# Patient Record
Sex: Female | Born: 1980 | Hispanic: Yes | Smoking: Never smoker
Health system: Southern US, Community
[De-identification: ages and names within clinical notes are randomized; demographics above are authoritative.]

---

## 2017-05-13 LAB — OB RESULTS CONSOLE ANTIBODY SCREEN: Antibody Screen: NEGATIVE

## 2017-05-13 LAB — OB RESULTS CONSOLE PLATELET COUNT: PLATELETS: 226

## 2017-05-13 LAB — OB RESULTS CONSOLE HEPATITIS B SURFACE ANTIGEN: HEP B S AG: NEGATIVE

## 2017-05-13 LAB — OB RESULTS CONSOLE HIV ANTIBODY (ROUTINE TESTING): HIV: NONREACTIVE

## 2017-05-13 LAB — OB RESULTS CONSOLE RUBELLA ANTIBODY, IGM: RUBELLA: IMMUNE

## 2017-05-13 LAB — OB RESULTS CONSOLE ABO/RH: RH Type: NEGATIVE

## 2017-05-13 LAB — OB RESULTS CONSOLE HGB/HCT, BLOOD
HCT: 36
Hemoglobin: 11.8

## 2017-05-13 LAB — OB RESULTS CONSOLE RPR: RPR: NONREACTIVE

## 2017-07-01 DIAGNOSIS — O09529 Supervision of elderly multigravida, unspecified trimester: Secondary | ICD-10-CM | POA: Insufficient documentation

## 2017-07-24 ENCOUNTER — Encounter: Payer: Self-pay | Admitting: *Deleted

## 2017-07-29 ENCOUNTER — Encounter: Payer: Self-pay | Admitting: *Deleted

## 2017-07-29 ENCOUNTER — Ambulatory Visit (INDEPENDENT_AMBULATORY_CARE_PROVIDER_SITE_OTHER): Payer: Medicaid Other | Admitting: Obstetrics & Gynecology

## 2017-07-29 ENCOUNTER — Encounter: Payer: Self-pay | Admitting: Obstetrics & Gynecology

## 2017-07-29 VITALS — BP 101/68 | HR 97 | Ht <= 58 in | Wt 263.0 lb

## 2017-07-29 DIAGNOSIS — Z6791 Unspecified blood type, Rh negative: Secondary | ICD-10-CM | POA: Diagnosis not present

## 2017-07-29 DIAGNOSIS — O4400 Placenta previa specified as without hemorrhage, unspecified trimester: Secondary | ICD-10-CM | POA: Insufficient documentation

## 2017-07-29 DIAGNOSIS — O0993 Supervision of high risk pregnancy, unspecified, third trimester: Secondary | ICD-10-CM

## 2017-07-29 DIAGNOSIS — O99213 Obesity complicating pregnancy, third trimester: Secondary | ICD-10-CM

## 2017-07-29 DIAGNOSIS — O99210 Obesity complicating pregnancy, unspecified trimester: Secondary | ICD-10-CM

## 2017-07-29 DIAGNOSIS — O34219 Maternal care for unspecified type scar from previous cesarean delivery: Secondary | ICD-10-CM

## 2017-07-29 DIAGNOSIS — O26899 Other specified pregnancy related conditions, unspecified trimester: Secondary | ICD-10-CM

## 2017-07-29 DIAGNOSIS — O4403 Placenta previa specified as without hemorrhage, third trimester: Secondary | ICD-10-CM

## 2017-07-29 DIAGNOSIS — O0990 Supervision of high risk pregnancy, unspecified, unspecified trimester: Secondary | ICD-10-CM

## 2017-07-29 MED ORDER — RHO D IMMUNE GLOBULIN 1500 UNIT/2ML IJ SOSY
300.0000 ug | PREFILLED_SYRINGE | Freq: Once | INTRAMUSCULAR | Status: AC
  Administered 2017-07-29: 300 ug via INTRAMUSCULAR

## 2017-07-29 NOTE — Progress Notes (Signed)
  Subjective:    Isabel Lewis is a [id number] 1560w2d being seen today for her first obstetrical visit.  Her obstetrical history is significant for 5 previous cesarean sections and complete placenta previa. Patient does intend to breast feed. Pregnancy history fully reviewed.  Patient reports no complaints.  Vitals:   07/29/17 1421 07/29/17 1425  BP: 101/68   Pulse: 97   Weight: 263 lb (119.3 kg)   Height:  4\' 10"  (1.473 m)    HISTORY: OB History  Gravida Para Term Preterm AB Living  8 5 5   2 5   SAB TAB Ectopic Multiple Live Births  1       5    # Outcome Date GA Lbr Len/2nd Weight Sex Delivery Anes PTL Lv  8 Current           7 Term 02/20/12    M CS-LTranv   LIV  6 Term 03/16/08    F CS-LTranv   LIV  5 Term 08/24/06    M CS-LTranv   LIV  4 Term 05/24/04    F CS-LTranv   LIV  3 Term 04/13/00    F CS-LTranv   LIV  2 AB           1 SAB              History reviewed. No pertinent past medical history. Past Surgical History:  Procedure Laterality Date  . CESAREAN SECTION     x5   History reviewed. No pertinent family history.   Exam    Uterus:   30 cm                                      Skin: normal coloration and turgor, no rashes    Neurologic: oriented, normal mood   Extremities: normal strength, tone, and muscle mass   HEENT PERRLA   Mouth/Teeth mucous membranes moist, pharynx normal without lesions and dental hygiene good   Neck supple   Cardiovascular: regular rate and rhythm   Respiratory:  appears well, vitals normal, no respiratory distress, acyanotic, normal RR   Abdomen: gravid          Assessment:    Pregnancy: [id number] Patient Active Problem List   Diagnosis Date Noted  . Supervision of high risk pregnancy, antepartum 07/29/2017  . Placenta previa 07/29/2017  . History of cesarean delivery, currently pregnant 07/29/2017  . Rh negative state in antepartum period 07/29/2017        Plan:     Initial labs reviewed Prenatal  vitamins. Problem list reviewed and updated. Genetic Screening discussed .  Ultrasound discussed; fetal survey: requested.  Follow up in 2 weeks. 50% of 30 min visit spent on counseling and coordination of care.  Repeat CS at 37 weeks if placenta previa 2 hr GTT needed   Scheryl DarterJames Arnold 07/29/2017

## 2017-08-03 ENCOUNTER — Ambulatory Visit (HOSPITAL_COMMUNITY)
Admission: RE | Admit: 2017-08-03 | Discharge: 2017-08-03 | Disposition: A | Discharge status: Home / Self Care | Payer: Medicaid Other | Source: Ambulatory Visit | Attending: Obstetrics & Gynecology | Admitting: Obstetrics & Gynecology

## 2017-08-03 ENCOUNTER — Other Ambulatory Visit: Payer: Medicaid Other

## 2017-08-03 ENCOUNTER — Encounter (HOSPITAL_COMMUNITY): Payer: Self-pay

## 2017-08-03 ENCOUNTER — Other Ambulatory Visit: Payer: Self-pay | Admitting: Obstetrics & Gynecology

## 2017-08-03 ENCOUNTER — Ambulatory Visit (HOSPITAL_COMMUNITY): Payer: Self-pay

## 2017-08-03 DIAGNOSIS — O99213 Obesity complicating pregnancy, third trimester: Secondary | ICD-10-CM

## 2017-08-03 DIAGNOSIS — O4403 Placenta previa specified as without hemorrhage, third trimester: Secondary | ICD-10-CM

## 2017-08-03 DIAGNOSIS — Z3689 Encounter for other specified antenatal screening: Secondary | ICD-10-CM | POA: Diagnosis not present

## 2017-08-03 DIAGNOSIS — Z3A30 30 weeks gestation of pregnancy: Secondary | ICD-10-CM | POA: Insufficient documentation

## 2017-08-03 DIAGNOSIS — O0990 Supervision of high risk pregnancy, unspecified, unspecified trimester: Secondary | ICD-10-CM

## 2017-08-03 DIAGNOSIS — O34219 Maternal care for unspecified type scar from previous cesarean delivery: Secondary | ICD-10-CM | POA: Diagnosis not present

## 2017-08-03 DIAGNOSIS — O0993 Supervision of high risk pregnancy, unspecified, third trimester: Secondary | ICD-10-CM | POA: Insufficient documentation

## 2017-08-03 DIAGNOSIS — O09523 Supervision of elderly multigravida, third trimester: Secondary | ICD-10-CM | POA: Diagnosis not present

## 2017-08-03 DIAGNOSIS — Z98891 History of uterine scar from previous surgery: Secondary | ICD-10-CM

## 2017-08-04 LAB — CBC
HEMATOCRIT: 35 % (ref 34.0–46.6)
Hemoglobin: 11.6 g/dL (ref 11.1–15.9)
MCH: 28.1 pg (ref 26.6–33.0)
MCHC: 33.1 g/dL (ref 31.5–35.7)
MCV: 85 fL (ref 79–97)
Platelets: 180 10*3/uL (ref 150–379)
RBC: 4.13 x10E6/uL (ref 3.77–5.28)
RDW: 14.2 % (ref 12.3–15.4)
WBC: 5.4 10*3/uL (ref 3.4–10.8)

## 2017-08-04 LAB — GLUCOSE TOLERANCE, 2 HOURS W/ 1HR
GLUCOSE, 2 HOUR: 135 mg/dL (ref 65–152)
Glucose, 1 hour: 186 mg/dL — ABNORMAL HIGH (ref 65–179)
Glucose, Fasting: 93 mg/dL — ABNORMAL HIGH (ref 65–91)

## 2017-08-04 LAB — HIV ANTIBODY (ROUTINE TESTING W REFLEX): HIV SCREEN 4TH GENERATION: NONREACTIVE

## 2017-08-04 LAB — RPR: RPR: NONREACTIVE

## 2017-08-04 NOTE — Addendum Note (Signed)
Encounter addended by: Adam PhenixArnold, James G, MD on: 08/04/2017 11:17 AM  Actions taken: Problem List reviewed

## 2017-08-05 ENCOUNTER — Encounter: Payer: Self-pay | Admitting: Obstetrics and Gynecology

## 2017-08-05 ENCOUNTER — Other Ambulatory Visit: Payer: Self-pay | Admitting: Obstetrics and Gynecology

## 2017-08-05 DIAGNOSIS — O24419 Gestational diabetes mellitus in pregnancy, unspecified control: Secondary | ICD-10-CM | POA: Insufficient documentation

## 2017-08-05 DIAGNOSIS — O24410 Gestational diabetes mellitus in pregnancy, diet controlled: Secondary | ICD-10-CM

## 2017-08-11 ENCOUNTER — Ambulatory Visit (INDEPENDENT_AMBULATORY_CARE_PROVIDER_SITE_OTHER): Payer: Medicaid Other | Admitting: Obstetrics & Gynecology

## 2017-08-11 ENCOUNTER — Encounter: Payer: Self-pay | Admitting: Obstetrics & Gynecology

## 2017-08-11 VITALS — BP 114/83 | HR 120 | Wt 263.5 lb

## 2017-08-11 DIAGNOSIS — O4403 Placenta previa specified as without hemorrhage, third trimester: Secondary | ICD-10-CM

## 2017-08-11 DIAGNOSIS — O99210 Obesity complicating pregnancy, unspecified trimester: Secondary | ICD-10-CM

## 2017-08-11 DIAGNOSIS — O34219 Maternal care for unspecified type scar from previous cesarean delivery: Secondary | ICD-10-CM

## 2017-08-11 DIAGNOSIS — O24419 Gestational diabetes mellitus in pregnancy, unspecified control: Secondary | ICD-10-CM

## 2017-08-11 DIAGNOSIS — O0990 Supervision of high risk pregnancy, unspecified, unspecified trimester: Secondary | ICD-10-CM

## 2017-08-11 NOTE — Progress Notes (Signed)
PRENATAL VISIT NOTE  Subjective:  Isabel Lewis is a 37 y.o. [id number] at [redacted]w[redacted]d being seen today for ongoing prenatal care.  She is currently monitored for the following issues for this high-risk pregnancy and has Supervision of high risk pregnancy, antepartum; Anterior placenta previa; History of cesarean delivery x 5, currently pregnant; Rh negative state in antepartum period; and Gestational diabetes mellitus (GDM), antepartum on their problem list.  Patient reports no complaints.  Contractions: Not present. Vag. Bleeding: None.  Movement: Present. Denies leaking of fluid.   The following portions of the patient's history were reviewed and updated as appropriate: allergies, current medications, past family history, past medical history, past social history, past surgical history and problem list. Problem list updated.  Objective:   Vitals:   08/11/17 0942  BP: 114/83  Pulse: (!) 120  Weight: 263 lb 8 oz (119.5 kg)    Fetal Status: Fetal Heart Rate (bpm): 148 Fundal Height: 36 cm Movement: Present     General:  Alert, oriented and cooperative. Patient is in no acute distress.  Skin: Skin is warm and dry. No rash noted.   Cardiovascular: Normal heart rate noted  Respiratory: Normal respiratory effort, no problems with respiration noted  Abdomen: Soft, gravid, appropriate for gestational age.        Pelvic: Cervical exam deferred        Extremities: Normal range of motion.  Edema: Trace  Mental Status:  Normal mood and affect. Normal behavior. Normal judgment and thought content.   [place] Mfm Ob Transvaginal  Result Date: 08/03/2017 ----------------------------------------------------------------------  OBSTETRICS REPORT                      (Signed Final 08/03/2017 02:05 pm) ---------------------------------------------------------------------- Patient Info  ID #:       [id number]                          D.O.B.:  08-19-1980 (37 yrs)  Name:       Isabel Lewis                     Visit Date:  08/03/2017 11:49 am ---------------------------------------------------------------------- Performed By  Performed By:     Hurman Horn          Ref. Address:     [address]                                                             [place], [place]  [address]  Attending:        Durwin Nora       Location:         [facility]                    MD  Referred By:      Adam Phenix                    MD ---------------------------------------------------------------------- Orders   #  Description                                 Code   1  [place] MFM OB DETAIL +14 WK                     76811.01   2  [place] MFM OB TRANSVAGINAL                      86578.4  ----------------------------------------------------------------------   #  Ordered By               Order #        Accession #    Episode #   1  Scheryl Darter             [id number]      [id number]     [id number]   2  Scheryl Darter             [id number]      [id number]     [id number]  ---------------------------------------------------------------------- Indications   [redacted] weeks gestation of pregnancy                Z3A.30   History of cesarean delivery, currently        O34.219   pregnant (x5)   Maternal morbid obesity                        O99.210 E66.01   Advanced maternal age multigravida 5+,        O42.523   third trimester (NIPS neg)   Placenta previa specified as without           O44.03   hemorrhage, third trimester   Late to prenatal care, third trimester         O09.33   Encounter for fetal anatomic survey            Z36.89  ---------------------------------------------------------------------- OB History  Blood Type:            Height:  4'10"  Weight (lb):  262       BMI:  54.75  Gravidity:    8         Term:   5        Prem:   0        SAB:   1  TOP:          1       Ectopic:  0        Living: 5  ---------------------------------------------------------------------- Fetal Evaluation  Num Of Fetuses:     1  Fetal Heart         153  Rate(bpm):  Cardiac Activity:   Observed  Presentation:       Breech  Placenta:           Anterior previa  P. Cord Insertion:  Visualized  Amniotic Fluid  AFI FV:      Subjectively within normal limits  AFI Sum(cm)     %Tile       Largest Pocket(cm)  12.83           36          4.93  RUQ(cm)       RLQ(cm)       LUQ(cm)        LLQ(cm)  1.82          4.93          4.25           1.83 ---------------------------------------------------------------------- Biometry  BPD:      78.1  mm     G. Age:  31w 2d         79  %    CI:         74.1   %    70 - 86                                                          FL/HC:      19.9   %    19.2 - 21.4  HC:      288.1  mm     G. Age:  31w 5d         64  %    HC/AC:      0.99        0.99 - 1.21  AC:      291.6  mm     G. Age:  33w 1d       > 97  %    FL/BPD:     73.4   %    71 - 87  FL:       57.3  mm     G. Age:  30w 0d         36  %    FL/AC:      19.7   %    20 - 24  Est. FW:    1868  gm      4 lb 2 oz     81  % ---------------------------------------------------------------------- Gestational Age  LMP:           30w 0d        Date:  01/05/17                 EDD:   10/12/17  U/S Today:     31w 4d                                        EDD:   10/01/17  Best:          30w 0d     Det. By:  LMP  (01/05/17)          EDD:   10/12/17 ---------------------------------------------------------------------- Anatomy  Cranium:               Appears normal         Aortic Arch:            Appears normal  Cavum:  Appears normal         Ductal Arch:            Not well visualized  Ventricles:            Appears normal         Diaphragm:              Appears normal  Choroid Plexus:        Appears normal         Stomach:                Appears normal, left                                                                        sided  Cerebellum:             Appears normal         Abdomen:                Appears normal  Posterior Fossa:       Not well visualized    Abdominal Wall:         Appears nml (cord                                                                        insert, abd wall)  Nuchal Fold:           Not applicable (>20    Cord Vessels:           Appears normal ([redacted]                         wks GA)                                        vessel cord)  Face:                  Not well visualized    Kidneys:                Appear normal  Lips:                  Not well visualized    Bladder:                Appears normal  Thoracic:              Appears normal         Spine:                  Ltd views no  intracranial signs of                                                                        NT  Heart:                 Not well visualized    Upper Extremities:      LUE Appears                                                                        normal; RUE NWS  RVOT:                  Appears normal         Lower Extremities:      Appears normal  LVOT:                  Appears normal  Other:  Fetus appears to be a female. Technically difficult due to maternal          habitus and fetal position. ---------------------------------------------------------------------- Cervix Uterus Adnexa  Cervix  Length:           3.81  cm.  Normal appearance by transvaginal scan  Uterus  No abnormality visualized.  Left Ovary  Not visualized.  Right Ovary  Not visualized.  Adnexa:       No abnormality visualized. ---------------------------------------------------------------------- Impression  Single living intrauterine pregnancy at 30w 0d, AMA, negative  NIPS  Breech presentation.  Placenta Anterior previa.  Appropriate fetal growth.  Normal amniotic fluid volume.  The fetal anatomic survey is NOT complete; limitations as  above  Normal fetal anatomy.  No fetal anomalies or soft markers of  aneuploidy seen.  The adnexa appear normal bilaterally without masses.  The cervix measures 3.81cm on transabdominal imaging  without funneling. ---------------------------------------------------------------------- Recommendations  Interval growth and attempt to complete survey in 6 weeks  Pelvic rest (previa)  Precautions. ----------------------------------------------------------------------               Durwin Nora, MD Electronically Signed Final Report   08/03/2017 02:05 pm ----------------------------------------------------------------------  [place] Mfm Ob Detail +14 Wk  Result Date: 08/03/2017 ----------------------------------------------------------------------  OBSTETRICS REPORT                      (Signed Final 08/03/2017 02:05 pm) ---------------------------------------------------------------------- Patient Info  ID #:       [id number]                          D.O.B.:  May 01, 1981 (37 yrs)  Name:       Isabel Lewis                     Visit Date: 08/03/2017 11:49 am ---------------------------------------------------------------------- Performed By  Performed By:     Hurman Horn          Ref. Address:     [address]  [address]                                                             [place], [place]                                                             [address]  Attending:        Durwin Nora       Location:         [facility]                    MD  Referred By:      Adam Phenix                    MD ---------------------------------------------------------------------- Orders   #  Description                                 Code   1  [place] MFM OB DETAIL +14 WK                     76811.01   2  [place] MFM OB TRANSVAGINAL                      60454.0  ----------------------------------------------------------------------   #  Ordered By               Order #        Accession #    Episode #   1  Scheryl Darter              [id number]      [id number]     [id number]   2  Scheryl Darter             [id number]      [id number]     [id number]  ---------------------------------------------------------------------- Indications   [redacted] weeks gestation of pregnancy                Z3A.30   History of cesarean delivery, currently        O34.219   pregnant (x5)   Maternal morbid obesity                        O99.210 E66.01   Advanced maternal age multigravida 66+,        O27.523   third trimester (NIPS neg)   Placenta previa specified as without           O44.03   hemorrhage, third trimester   Late to prenatal care, third trimester  O09.33   Encounter for fetal anatomic survey            Z36.89  ---------------------------------------------------------------------- OB History  Blood Type:            Height:  4'10"  Weight (lb):  262       BMI:  54.75  Gravidity:    8         Term:   5        Prem:   0        SAB:   1  TOP:          1       Ectopic:  0        Living: 5 ---------------------------------------------------------------------- Fetal Evaluation  Num Of Fetuses:     1  Fetal Heart         153  Rate(bpm):  Cardiac Activity:   Observed  Presentation:       Breech  Placenta:           Anterior previa  P. Cord Insertion:  Visualized  Amniotic Fluid  AFI FV:      Subjectively within normal limits  AFI Sum(cm)     %Tile       Largest Pocket(cm)  12.83           36          4.93  RUQ(cm)       RLQ(cm)       LUQ(cm)        LLQ(cm)  1.82          4.93          4.25           1.83 ---------------------------------------------------------------------- Biometry  BPD:      78.1  mm     G. Age:  31w 2d         79  %    CI:         74.1   %    70 - 86                                                          FL/HC:      19.9   %    19.2 - 21.4  HC:      288.1  mm     G. Age:  31w 5d         64  %    HC/AC:      0.99        0.99 - 1.21  AC:      291.6  mm     G. Age:  33w 1d       > 97  %    FL/BPD:     73.4   %    71 - 87  FL:       57.3  mm      G. Age:  30w 0d         36  %    FL/AC:      19.7   %    20 - 24  Est. FW:    1868  gm      4 lb 2 oz     81  % ---------------------------------------------------------------------- Gestational Age  LMP:           30w 0d        Date:  01/05/17                 EDD:   10/12/17  U/S Today:     31w 4d                                        EDD:   10/01/17  Best:          30w 0d     Det. By:  LMP  (01/05/17)          EDD:   10/12/17 ---------------------------------------------------------------------- Anatomy  Cranium:               Appears normal         Aortic Arch:            Appears normal  Cavum:                 Appears normal         Ductal Arch:            Not well visualized  Ventricles:            Appears normal         Diaphragm:              Appears normal  Choroid Plexus:        Appears normal         Stomach:                Appears normal, left                                                                        sided  Cerebellum:            Appears normal         Abdomen:                Appears normal  Posterior Fossa:       Not well visualized    Abdominal Wall:         Appears nml (cord                                                                        insert, abd wall)  Nuchal Fold:           Not applicable (>20    Cord Vessels:           Appears normal ([redacted]                         wks GA)  vessel cord)  Face:                  Not well visualized    Kidneys:                Appear normal  Lips:                  Not well visualized    Bladder:                Appears normal  Thoracic:              Appears normal         Spine:                  Ltd views no                                                                        intracranial signs of                                                                        NT  Heart:                 Not well visualized    Upper Extremities:      LUE Appears                                                                         normal; RUE NWS  RVOT:                  Appears normal         Lower Extremities:      Appears normal  LVOT:                  Appears normal  Other:  Fetus appears to be a female. Technically difficult due to maternal          habitus and fetal position. ---------------------------------------------------------------------- Cervix Uterus Adnexa  Cervix  Length:           3.81  cm.  Normal appearance by transvaginal scan  Uterus  No abnormality visualized.  Left Ovary  Not visualized.  Right Ovary  Not visualized.  Adnexa:       No abnormality visualized. ---------------------------------------------------------------------- Impression  Single living intrauterine pregnancy at 30w 0d, AMA, negative  NIPS  Breech presentation.  Placenta Anterior previa.  Appropriate fetal growth.  Normal amniotic fluid volume.  The fetal anatomic survey is NOT complete; limitations as  above  Normal fetal anatomy.  No fetal anomalies or soft markers of aneuploidy seen.  The adnexa appear normal bilaterally without masses.  The cervix measures 3.81cm on  transabdominal imaging  without funneling. ---------------------------------------------------------------------- Recommendations  Interval growth and attempt to complete survey in 6 weeks  Pelvic rest (previa)  Precautions. ----------------------------------------------------------------------               Durwin Nora, MD Electronically Signed Final Report   08/03/2017 02:05 pm ----------------------------------------------------------------------   Assessment and Plan:  Pregnancy: [id number] at [redacted]w[redacted]d  1. Gestational diabetes mellitus (GDM), antepartum, gestational diabetes method of control unspecified Recently diagnosed, will meet with DM educator soon.  2. Maternal morbid obesity, antepartum (HCC) Will meet with Nutritionist soon, growth scan ordered - US MFM OB FOLLOW UP; Future  3. Placenta previa in third trimester 4. History of cesarean delivery x 5,  currently pregnant Reviewed last operative report, had extensive adhesive disease, uterine window and needed a high vertical/classical hysterotomy (see operative notes details that are copied and pasted in Overview section under History of cesarean section in Problem List).  Anterior previa is concerning for possible accreta, may need MRI.  MFM consulted to evaluate closely for accreta, recommend delivery plan (?36 weeks), and do follow up growth scan.  BTL papers signed today. - AMB referral to maternal fetal medicine - [place] MFM OB FOLLOW UP; Future  5. Supervision of high risk pregnancy, antepartum Preterm labor symptoms and general obstetric precautions including but not limited to vaginal bleeding, contractions, leaking of fluid and fetal movement were reviewed in detail with the patient. Please refer to After Visit Summary for other counseling recommendations.  Return in about 1 week (around 08/18/2017) for OB Visit.   Jaynie Collins, MD

## 2017-08-11 NOTE — Patient Instructions (Addendum)
Return to clinic for any scheduled appointments or obstetric concerns, or go to [facility] for evaluation   Gestational Diabetes Mellitus, Self Care Caring for yourself after you have been diagnosed with gestational diabetes (gestational diabetes mellitus) means keeping your blood sugar (glucose) under control with a balance of:  Nutrition.  Exercise.  Lifestyle changes.  Medicines or insulin, if necessary.  Support from your team of health care providers and others.  The following information explains what you need to know to manage your gestational diabetes at home. What do I need to do to manage my blood glucose?  Check your blood glucose every day during your pregnancy. Do this as often as told by your health care provider.  Contact your health care provider if your blood glucose is above your target for 2 tests in a row. Your health care provider will set individualized treatment goals for you. Generally, the goal of treatment is to maintain the following blood glucose levels during pregnancy:  After not eating for 8 hours (after fasting): at or below 95 mg/dL (5.3 mmol/L).  After meals (postprandial): ? One hour after a meal: at or below 140 mg/dL (7.8 mmol/L). ? Two hours after a meal: at or below 120 mg/dL (6.7 mmol/L).  A1c (hemoglobin A1c) level: 6-6.5%.  What do I need to know about hyperglycemia and hypoglycemia? What is hyperglycemia? Hyperglycemia, also called high blood glucose, occurs when blood glucose is too high. Make sure you know the early signs of hyperglycemia, such as:  Increased thirst.  Hunger.  Feeling very tired.  Needing to urinate more often than usual.  Blurry vision.  What is hypoglycemia? Hypoglycemia, also called low blood glucose, occurswith a blood glucose level at or below 70 mg/dL (3.9 mmol/L). The risk for hypoglycemia increases during or after exercise, during sleep, during illness, and when skipping meals or not eating for a long time  (fasting). It is important to know the symptoms of hypoglycemia and treat it right away. Always have a 15-gram rapid-acting carbohydrate snack with you to treat low blood glucose.Family members and close friends should also know the symptoms and should understand how to treat hypoglycemia, in case you are not able to treat yourself. What are the symptoms of hypoglycemia? Hypoglycemia symptoms can include:  Hunger.  Anxiety.  Sweating and feeling clammy.  Confusion.  Dizziness or feeling light-headed.  Sleepiness.  Nausea.  Increased heart rate.  Headache.  Blurry vision.  Seizure.  Nightmares.  Tingling or numbness around the mouth, lips, or tongue.  A change in speech.  Decreased ability to concentrate.  A change in coordination.  Restless sleep.  Tremors or shakes.  Fainting.  Irritability.  How do I treat hypoglycemia?  If you are alert and able to swallow safely, follow the 15:15 rule:  Take 15 grams of a rapid-acting carbohydrate. Rapid-acting options include: ? 1 tube of glucose gel. ? 3 glucose pills. ? 6-8 pieces of hard candy. ? 4 oz (120 mL) of fruit juice. ? 4 oz (120 mL) of regular (not diet) soda.  Check your blood glucose 15 minutes after you take the carbohydrate.  If the repeat blood glucose level is still at or below 70 mg/dL (3.9 mmol/L), take 15 grams of a carbohydrate again.  If your blood glucose level does not increase above 70 mg/dL (3.9 mmol/L) after 3 tries, seek emergency medical care.  After your blood glucose level returns to normal, eat a meal or a snack within 1 hour.  How do  I treat severe hypoglycemia? Severe hypoglycemia is when your blood glucose level is at or below 54 mg/dL (3 mmol/L). Severe hypoglycemia is an emergency. Do not wait to see if the symptoms will go away. Get medical help right away. Call your local emergency services (911 in the [place] Do not drive yourself to the hospital. If you have severe  hypoglycemia and you cannot eat or drink, you may need an injection of glucagon. A family member or close friend should learn how to check your blood glucose and how to give you a glucagon injection. Ask your health care provider if you need to have an emergency glucagon injection kit available. Severe hypoglycemia may need to be treated in a hospital. The treatment may include getting glucose through an IV tube. You may also need treatment for the cause of your hypoglycemia. What else can I do to manage my gestational diabetes? Take your diabetes medicines as told  If your health care provider prescribed insulin or diabetes medicines, take them every day.  Do not run out of insulin or other diabetes medicines that you take. Plan ahead so you always have these available.  If you use insulin, adjust your dosage based on how physically active you are and what foods you eat. Your health care provider will tell you how to adjust your dosage. Make healthy food choices  The things that you eat and drink affect your blood glucose. Making good choices helps to control your diabetes and prevent other health problems. A healthy meal plan includes eating lean proteins, complex carbohydrates, fresh fruits and vegetables, low-fat dairy products, and healthy fats. Make an appointment to see a diet and nutrition specialist (registered dietitian) to help you create an eating plan that is right for you. Make sure that you:  Follow instructions from your health care provider about eating or drinking restrictions.  Drink enough fluid to keep your urine clear or pale yellow.  Eat healthy snacks between nutritious meals.  Track the carbohydrates that you eat. Do this by reading food labels and learning the standard serving sizes of foods.  Follow your sick day plan whenever you cannot eat or drink as usual. Make this plan in advance with your health care provider.  Stay active   Do at least 30 minutes of  physical activity a day, or as much physical activity as your health care provider recommends during your pregnancy. ? Doing 10 minutes of exercise 30 minutes after each meal may help to control postprandial blood glucose levels.  If you start a new exercise or activity, work with your health care provider to adjust your insulin, medicines, or food intake as needed. Make healthy lifestyle choices  Do not drink alcohol.  Do not use any tobacco products, such as cigarettes, chewing tobacco, and e-cigarettes. If you need help quitting, ask your health care provider.  Learn to manage stress. If you need help with this, ask your health care provider. Care for your body  Keep your immunizations up to date.  Brush your teeth and gums two times a day, and floss at least one time a day.  Visit your dentist at least once every 6 months.  Maintain a healthy weight during your pregnancy. General instructions   Take over-the-counter and prescription medicines only as told by your health care provider.  Talk with your health care provider about your risk for high blood pressure during pregnancy (preeclampsia or eclampsia).  Share your diabetes management plan with people in your  workplace, school, and household.  Check your urine for ketones during your pregnancy when you are ill and as told by your health care provider.  Carry a medical alert card or wear medical alert jewelry.  Ask your health care provider: ? Do I need to meet with a diabetes educator? ? Where can I find a support group for people with diabetes?  Keep all follow-up visits during your pregnancy (prenatal) and after delivery (postnatal) as told by your health care provider. This is important. Get the care that you need after delivery  Have your blood glucose level checked 4-12 weeks after delivery. This is done with an oral glucose tolerance test (OGTT).  Get screened for diabetes at least every 3 years, or as often as  told by your health care provider. Where to find more information: To learn more about gestational diabetes, visit:  American Diabetes Association (ADA): www.diabetes.org/diabetes-basics/gestational  Centers for Disease Control and Prevention (CDC): http://sanchez-watson.com/.pdf  This information is not intended to replace advice given to you by your health care provider. Make sure you discuss any questions you have with your health care provider. Document Released: 09/24/2015 Document Revised: 11/08/2015 Document Reviewed: 07/06/2015 Elsevier Interactive Patient Education  [employer].

## 2017-08-18 ENCOUNTER — Ambulatory Visit (INDEPENDENT_AMBULATORY_CARE_PROVIDER_SITE_OTHER): Payer: Medicaid Other | Admitting: Obstetrics & Gynecology

## 2017-08-18 VITALS — BP 100/69 | HR 106 | Wt 264.3 lb

## 2017-08-18 DIAGNOSIS — Z23 Encounter for immunization: Secondary | ICD-10-CM | POA: Diagnosis not present

## 2017-08-18 DIAGNOSIS — O4403 Placenta previa specified as without hemorrhage, third trimester: Secondary | ICD-10-CM

## 2017-08-18 DIAGNOSIS — O24419 Gestational diabetes mellitus in pregnancy, unspecified control: Secondary | ICD-10-CM

## 2017-08-18 DIAGNOSIS — O0993 Supervision of high risk pregnancy, unspecified, third trimester: Secondary | ICD-10-CM | POA: Diagnosis not present

## 2017-08-18 DIAGNOSIS — O34219 Maternal care for unspecified type scar from previous cesarean delivery: Secondary | ICD-10-CM

## 2017-08-18 DIAGNOSIS — O0990 Supervision of high risk pregnancy, unspecified, unspecified trimester: Secondary | ICD-10-CM

## 2017-08-18 NOTE — Progress Notes (Signed)
   PRENATAL VISIT NOTE  Subjective:  Kenna Gilbertdna Hollingshed is a 37 y.o. [id number] at 4324w1d being seen today for ongoing prenatal care.  She is currently monitored for the following issues for this high-risk pregnancy and has Supervision of high risk pregnancy, antepartum; Anterior placenta previa; History of cesarean delivery x 5, currently pregnant; Rh negative state in antepartum period; and Gestational diabetes mellitus (GDM), antepartum on their problem list.  Patient reports no complaints.  Contractions: Not present. Vag. Bleeding: None.  Movement: Present. Denies leaking of fluid.   The following portions of the patient's history were reviewed and updated as appropriate: allergies, current medications, past family history, past medical history, past social history, past surgical history and problem list. Problem list updated.  Objective:   Vitals:   08/18/17 1116  BP: 100/69  Pulse: (!) 106  Weight: 264 lb 4.8 oz (119.9 kg)    Fetal Status: Fetal Heart Rate (bpm): 142 Fundal Height: 36 cm Movement: Present     General:  Alert, oriented and cooperative. Patient is in no acute distress.  Skin: Skin is warm and dry. No rash noted.   Cardiovascular: Normal heart rate noted  Respiratory: Normal respiratory effort, no problems with respiration noted  Abdomen: Soft, gravid, appropriate for gestational age.  Pain/Pressure: Present     Pelvic: Cervical exam deferred        Extremities: Normal range of motion.  Edema: None  Mental Status:  Normal mood and affect. Normal behavior. Normal judgment and thought content.   Assessment and Plan:  Pregnancy: [id number] at 924w1d  1. Supervision of high risk pregnancy, antepartum  - Tdap vaccine greater than or equal to 7yo IM  2. Gestational diabetes mellitus (GDM), antepartum, gestational diabetes method of control unspecified Needs DM teaching  3. Placenta previa in third trimester F/u next week for MFM consult  4. History of cesarean delivery x 5,  currently pregnant   Preterm labor symptoms and general obstetric precautions including but not limited to vaginal bleeding, contractions, leaking of fluid and fetal movement were reviewed in detail with the patient. Please refer to After Visit Summary for other counseling recommendations.  Return in about 1 week (around 08/25/2017).   Scheryl DarterJames Debby Clyne, MD

## 2017-08-18 NOTE — Patient Instructions (Signed)

## 2017-08-19 ENCOUNTER — Ambulatory Visit: Payer: Medicaid Other | Admitting: Registered"

## 2017-08-20 ENCOUNTER — Encounter: Payer: Self-pay | Admitting: Advanced Practice Midwife

## 2017-08-25 ENCOUNTER — Ambulatory Visit (HOSPITAL_COMMUNITY): Payer: Medicaid Other

## 2017-08-25 ENCOUNTER — Ambulatory Visit (HOSPITAL_COMMUNITY): Admission: RE | Admit: 2017-08-25 | Payer: Medicaid Other | Source: Ambulatory Visit

## 2017-08-25 ENCOUNTER — Encounter: Payer: Medicaid Other | Admitting: Obstetrics & Gynecology

## 2017-08-26 ENCOUNTER — Ambulatory Visit: Payer: Medicaid Other | Admitting: Registered"

## 2017-09-03 MED ORDER — IRON PO
1.00 | ORAL | Status: DC
Start: 2017-09-04 — End: 2017-09-03

## 2017-09-03 MED ORDER — ACETAMINOPHEN 500 MG PO TABS
1000.00 | ORAL_TABLET | ORAL | Status: DC
Start: 2017-09-04 — End: 2017-09-03

## 2017-09-03 MED ORDER — LACTATED RINGERS IV SOLN
INTRAVENOUS | Status: DC
Start: ? — End: 2017-09-03

## 2017-09-15 ENCOUNTER — Emergency Department (HOSPITAL_COMMUNITY): Payer: Medicaid Other

## 2017-09-15 ENCOUNTER — Other Ambulatory Visit: Payer: Self-pay

## 2017-09-15 ENCOUNTER — Inpatient Hospital Stay (HOSPITAL_COMMUNITY)
Admission: EM | Admit: 2017-09-15 | Discharge: 2017-09-18 | DRG: 776 | Disposition: A | Discharge status: Home / Self Care | Payer: Medicaid Other | Attending: Internal Medicine | Admitting: Internal Medicine

## 2017-09-15 ENCOUNTER — Encounter (HOSPITAL_COMMUNITY): Payer: Self-pay

## 2017-09-15 DIAGNOSIS — O09529 Supervision of elderly multigravida, unspecified trimester: Secondary | ICD-10-CM

## 2017-09-15 DIAGNOSIS — O8601 Infection of obstetric surgical wound, superficial incisional site: Secondary | ICD-10-CM | POA: Clinically undetermined

## 2017-09-15 DIAGNOSIS — R509 Fever, unspecified: Secondary | ICD-10-CM

## 2017-09-15 DIAGNOSIS — M7989 Other specified soft tissue disorders: Secondary | ICD-10-CM | POA: Diagnosis not present

## 2017-09-15 DIAGNOSIS — R071 Chest pain on breathing: Secondary | ICD-10-CM | POA: Diagnosis not present

## 2017-09-15 DIAGNOSIS — O99215 Obesity complicating the puerperium: Secondary | ICD-10-CM | POA: Diagnosis present

## 2017-09-15 DIAGNOSIS — Z90710 Acquired absence of both cervix and uterus: Secondary | ICD-10-CM | POA: Diagnosis present

## 2017-09-15 DIAGNOSIS — R000 Tachycardia, unspecified: Secondary | ICD-10-CM

## 2017-09-15 DIAGNOSIS — O4400 Placenta previa specified as without hemorrhage, unspecified trimester: Secondary | ICD-10-CM | POA: Diagnosis not present

## 2017-09-15 DIAGNOSIS — R6510 Systemic inflammatory response syndrome (SIRS) of non-infectious origin without acute organ dysfunction: Secondary | ICD-10-CM

## 2017-09-15 DIAGNOSIS — O8823 Thromboembolism in the puerperium: Secondary | ICD-10-CM | POA: Diagnosis present

## 2017-09-15 DIAGNOSIS — O8883 Other embolism in the puerperium: Principal | ICD-10-CM | POA: Diagnosis present

## 2017-09-15 DIAGNOSIS — Z8632 Personal history of gestational diabetes: Secondary | ICD-10-CM | POA: Diagnosis not present

## 2017-09-15 DIAGNOSIS — R079 Chest pain, unspecified: Secondary | ICD-10-CM | POA: Diagnosis present

## 2017-09-15 DIAGNOSIS — R0902 Hypoxemia: Secondary | ICD-10-CM

## 2017-09-15 DIAGNOSIS — L03818 Cellulitis of other sites: Secondary | ICD-10-CM | POA: Diagnosis not present

## 2017-09-15 DIAGNOSIS — I2699 Other pulmonary embolism without acute cor pulmonale: Secondary | ICD-10-CM

## 2017-09-15 LAB — BASIC METABOLIC PANEL
Anion gap: 11 (ref 5–15)
BUN: 6 mg/dL (ref 6–20)
CO2: 23 mmol/L (ref 22–32)
CREATININE: 0.74 mg/dL (ref 0.44–1.00)
Calcium: 8.8 mg/dL — ABNORMAL LOW (ref 8.9–10.3)
Chloride: 107 mmol/L (ref 101–111)
GFR calc Af Amer: >60 mL/min (ref >60)
GFR calc non Af Amer: >60 mL/min (ref >60)
GLUCOSE: 132 mg/dL — AB (ref 65–99)
POTASSIUM: 3.5 mmol/L (ref 3.5–5.1)
SODIUM: 141 mmol/L (ref 135–145)

## 2017-09-15 LAB — CBC
HEMATOCRIT: 28.5 % — AB (ref 36.0–46.0)
Hemoglobin: 8.6 g/dL — ABNORMAL LOW (ref 12.0–15.0)
MCH: 27.2 pg (ref 26.0–34.0)
MCHC: 30.2 g/dL (ref 30.0–36.0)
MCV: 90.2 fL (ref 78.0–100.0)
PLATELETS: 260 10*3/uL (ref 150–400)
RBC: 3.16 MIL/uL — ABNORMAL LOW (ref 3.87–5.11)
RDW: 16.6 % — AB (ref 11.5–15.5)
WBC: 9.2 10*3/uL (ref 4.0–10.5)

## 2017-09-15 LAB — I-STAT TROPONIN, ED: Troponin i, poc: 0.02 ng/mL (ref 0.00–0.08)

## 2017-09-15 MED ORDER — ADULT MULTIVITAMIN W/MINERALS CH
1.0000 | ORAL_TABLET | Freq: Every day | ORAL | Status: DC
  Administered 2017-09-16 – 2017-09-18 (×3): 1 via ORAL
  Filled 2017-09-15 (×3): qty 1

## 2017-09-15 MED ORDER — SODIUM CHLORIDE 0.9% FLUSH
3.0000 mL | Freq: Two times a day (BID) | INTRAVENOUS | Status: DC
  Administered 2017-09-16 – 2017-09-18 (×3): 3 mL via INTRAVENOUS

## 2017-09-15 MED ORDER — IOPAMIDOL (ISOVUE-370) INJECTION 76%
INTRAVENOUS | Status: AC
  Filled 2017-09-15: qty 100

## 2017-09-15 MED ORDER — DOCUSATE SODIUM 100 MG PO CAPS
100.0000 mg | ORAL_CAPSULE | Freq: Two times a day (BID) | ORAL | Status: DC
  Administered 2017-09-15 – 2017-09-18 (×6): 100 mg via ORAL
  Filled 2017-09-15 (×6): qty 1

## 2017-09-15 MED ORDER — KETOROLAC TROMETHAMINE 30 MG/ML IJ SOLN
30.0000 mg | Freq: Three times a day (TID) | INTRAMUSCULAR | Status: DC | PRN
  Administered 2017-09-15 – 2017-09-17 (×3): 30 mg via INTRAVENOUS
  Filled 2017-09-15 (×3): qty 1

## 2017-09-15 MED ORDER — IBUPROFEN 400 MG PO TABS: 600.0000 mg | ORAL_TABLET | Freq: Four times a day (QID) | ORAL | Status: DC | PRN

## 2017-09-15 MED ORDER — OXYCODONE HCL 5 MG PO TABS
5.0000 mg | ORAL_TABLET | ORAL | Status: DC | PRN
  Administered 2017-09-15 – 2017-09-17 (×8): 5 mg via ORAL
  Filled 2017-09-15 (×8): qty 1

## 2017-09-15 MED ORDER — SODIUM CHLORIDE 0.9% FLUSH: 3.0000 mL | INTRAVENOUS | Status: DC | PRN

## 2017-09-15 MED ORDER — HEPARIN BOLUS VIA INFUSION
4000.0000 [IU] | Freq: Once | INTRAVENOUS | Status: AC
  Administered 2017-09-15: 4000 [IU] via INTRAVENOUS
  Filled 2017-09-15: qty 4000

## 2017-09-15 MED ORDER — MORPHINE SULFATE (PF) 4 MG/ML IV SOLN
4.0000 mg | Freq: Once | INTRAVENOUS | Status: AC
  Administered 2017-09-15: 4 mg via INTRAVENOUS
  Filled 2017-09-15: qty 1

## 2017-09-15 MED ORDER — ACETAMINOPHEN 325 MG PO TABS
650.0000 mg | ORAL_TABLET | Freq: Four times a day (QID) | ORAL | Status: DC | PRN
  Administered 2017-09-16 – 2017-09-17 (×3): 650 mg via ORAL
  Filled 2017-09-15 (×3): qty 2

## 2017-09-15 MED ORDER — HEPARIN (PORCINE) IN NACL 100-0.45 UNIT/ML-% IJ SOLN
1450.0000 [IU]/h | INTRAMUSCULAR | Status: DC
  Administered 2017-09-15: 1250 [IU]/h via INTRAVENOUS
  Filled 2017-09-15: qty 250

## 2017-09-15 MED ORDER — IOPAMIDOL (ISOVUE-370) INJECTION 76%
100.0000 mL | Freq: Once | INTRAVENOUS | Status: AC | PRN
  Administered 2017-09-15: 100 mL via INTRAVENOUS

## 2017-09-15 MED ORDER — SODIUM CHLORIDE 0.9 % IV SOLN: 250.0000 mL | INTRAVENOUS | Status: DC | PRN

## 2017-09-15 NOTE — Progress Notes (Signed)
ANTICOAGULATION CONSULT NOTE - Initial Consult  Pharmacy Consult for heparin Indication: pulmonary embolus  No Known Allergies  Patient Measurements: Height: 4\' 10"  (147.3 cm) Weight: 252 lb (114.3 kg) IBW/kg (Calculated) : 40.9 Heparin Dosing Weight: 70 Kg  Vital Signs: Temp: 98.8 F (37.1 C) (04/02 1708) BP: 146/92 (04/02 1708) Pulse Rate: 89 (04/02 1708)  Labs: Recent Labs    09/15/17 1712  HGB 8.6*  HCT 28.5*  PLT 260  CREATININE 0.74    Estimated Creatinine Clearance: 108.9 mL/min (by C-G formula based on SCr of 0.74 mg/dL).   Medical History: History reviewed. No pertinent past medical history.   Assessment: [age] yoF presenting with chest pain s/p C-section on 3/21. HgB 8.6, PLT 260; no anticoagulation reported PTA  Goal of Therapy:  Heparin level 0.3-0.7 units/ml Monitor platelets by anticoagulation protocol: Yes   Plan:  Give 4000 units bolus x 1 Start heparin infusion at 1250 units/hr Check anti-Xa level in 6 hours and daily while on heparin Continue to monitor H&H and platelets  Isabel Lewis L Isabel Lewis 09/15/2017,7:39 PM

## 2017-09-15 NOTE — ED Triage Notes (Signed)
Pt presents with sudden onset of lower R chest pain that began last night; reports pain radiates around bra-line to back; +shortness of breath; pt also reports green drainage from mid-abdominal surgical site from C-section on 3/21 is g6p6. Pt is nursing.

## 2017-09-15 NOTE — ED Provider Notes (Signed)
Patient placed in Quick Look pathway, seen and evaluated   Chief Complaint: shortness of breath  HPI:   Isabel Lewis is a 37 y.o. [id number] who delivered her last baby by c/s 09/03/17 here today with chest pain and shortness of breath. This was the patient's 6th C-section. [facility] and [facility]. Review of patient's chart reports complications with most recent pregnancy, placenta accreta, and had c/section and hysterectomy @ [redacted] weeks gestation. Patient reports that the symptoms she is having today started last night and have gotten progressively worse.   ROS: Resp: shortness of breath  Skin: incision abdomen  Physical Exam:  BP (!) 146/92   Pulse 89   Temp 98.8 F (37.1 C)   Resp (!) 24   Ht 4\' 10"  (1.473 m)   Wt 114.3 kg (252 lb)   LMP 01/05/2017 (LMP Unknown)   SpO2 98%   Breastfeeding? Yes   BMI 52.67 kg/m    Gen: No distress  Neuro: Awake and Alert  Skin: Warm and dry, healing c/s wound abdomen  Lungs: decreased breath sounds, rales lower lungs    Focused Exam:    Initiation of care has begun. The patient has been counseled on the process, plan, and necessity for staying for the completion/evaluation, and the remainder of the medical screening examination    Janne Napoleoneese, Lindi Abram M, NP 09/15/17 1729    Tilden Fossaees, Elizabeth, MD 09/16/17 [phone]

## 2017-09-15 NOTE — ED Provider Notes (Signed)
[facility] EMERGENCY DEPARTMENT Provider Note   CSN: [id number] Arrival date & time: 09/15/17  1701     History   Chief Complaint Chief Complaint  Patient presents with  . Chest Pain    HPI Isabel Lewis is a 37 y.o. female.  The history is provided by the patient. No language interpreter was used.  Chest Pain      Isabel Lewis is a 37 y.o. female who presents to the Emergency Department complaining of chest pain. She presents to the emergency department for evaluation of right-sided chest pain that began last night. Pain is sharp and severe in nature. It is worse with deep breath. She has associated shortness of breath. She was discharged from the hospital about a week ago following cesarean section and hysterectomy for placenta acreta. She did receive blood transfusions during the hospital stay as well as prophylactic blood thinners. She was doing well post hospitalization until last night. She does report some lower extremity edema. No leg pain. No fevers, cough, hemoptysis. No vaginally bleeding. She has no history of blood clots and takes no hormones. She is a non-smoker. She is breast-feeding.  History reviewed. No pertinent past medical history.  Patient Active Problem List   Diagnosis Date Noted  . Gestational diabetes mellitus (GDM), antepartum 08/05/2017  . Supervision of high risk pregnancy, antepartum 07/29/2017  . Anterior placenta previa 07/29/2017  . History of cesarean delivery x 5, currently pregnant 07/29/2017  . Rh negative state in antepartum period 07/29/2017  . Antepartum multigravida of advanced maternal age 61/16/2019    Past Surgical History:  Procedure Laterality Date  . CESAREAN SECTION     x5     OB History    Gravida  8   Para  5   Term  5   Preterm      AB  2   Living  5     SAB  1   TAB      Ectopic      Multiple      Live Births  5            Home Medications    Prior to Admission medications     Medication Sig Start Date End Date Taking? Authorizing Provider  ibuprofen (ADVIL) 200 MG tablet Take 600 mg by mouth every 6 (six) hours as needed.   Yes [provider]  Prenatal Vit-Fe Fumarate-FA (MULTIVITAMIN-PRENATAL) 27-0.8 MG TABS tablet Take 1 tablet by mouth daily at 12 noon.   Yes [provider]    Family History History reviewed. No pertinent family history.  Social History Social History   Tobacco Use  . Smoking status: Never Smoker  . Smokeless tobacco: Never Used  Substance Use Topics  . Alcohol use: No    Frequency: Never  . Drug use: No     Allergies   Patient has no known allergies.   Review of Systems Review of Systems  Cardiovascular: Positive for chest pain.  All other systems reviewed and are negative.    Physical Exam Updated Vital Signs BP (!) 146/92   Pulse 89   Temp 98.8 F (37.1 C)   Resp (!) 24   Ht 4\' 10"  (1.473 m)   Wt 114.3 kg (252 lb)   LMP 01/05/2017 (LMP Unknown)   SpO2 98%   Breastfeeding? Yes   BMI 52.67 kg/m   Physical Exam  Constitutional: She is oriented to person, place, and time. She appears well-developed and  well-nourished. She appears distressed.  Uncomfortable appearing, tearful.  HENT:  Head: Normocephalic and atraumatic.  Cardiovascular: Normal rate and regular rhythm.  No murmur heard. Pulmonary/Chest: Effort normal and breath sounds normal. No respiratory distress.  Abdominal: Soft. There is no rebound and no guarding.  Mild abdominal tenderness. midline surgical incision site with tissue adhesive in place. There is minimal local erythema.  Musculoskeletal: She exhibits no tenderness.  Nonpitting edema to BLE  Neurological: She is alert and oriented to person, place, and time.  Skin: Skin is warm and dry. There is pallor.  Psychiatric:  Anxious and tearful  Nursing note and vitals reviewed.    ED Treatments / Results  Labs (all labs ordered are listed, but only abnormal results  are displayed) Labs Reviewed  BASIC METABOLIC PANEL - Abnormal; Notable for the following components:      Result Value   Glucose, Bld 132 (*)    Calcium 8.8 (*)    All other components within normal limits  CBC - Abnormal; Notable for the following components:   RBC 3.16 (*)    Hemoglobin 8.6 (*)    HCT 28.5 (*)    RDW 16.6 (*)    All other components within normal limits  I-STAT TROPONIN, ED    EKG EKG Interpretation  Date/Time:  Tuesday September 15 2017 19:52:04 EDT Ventricular Rate:  92 PR Interval:    QRS Duration: 77 QT Interval:  333 QTC Calculation: 412 R Axis:   81 Text Interpretation:  Sinus rhythm Confirmed by Tilden Fossa [id number] on 09/15/2017 7:59:56 PM   Radiology Ct Angio Chest Pe W And/or Wo Contrast  Result Date: 09/15/2017 CLINICAL DATA:  Right-sided chest pain with shortness of breath EXAM: CT ANGIOGRAPHY CHEST WITH CONTRAST TECHNIQUE: Multidetector CT imaging of the chest was performed using the standard protocol during bolus administration of intravenous contrast. Multiplanar CT image reconstructions and MIPs were obtained to evaluate the vascular anatomy. CONTRAST:  ISOVUE-370 IOPAMIDOL (ISOVUE-370) INJECTION 76% COMPARISON:  None. FINDINGS: Cardiovascular: Satisfactory opacification of the pulmonary arteries to the segmental level. Filling defect within the right inter lobar pulmonary artery and multiple right lower lobe segmental and subsegmental branches. Additional smaller emboli within left lower lobe segmental and subsegmental branches. RV LV ratio is normal. There is mild cardiomegaly. Nonaneurysmal aorta. No significant pericardial effusion. Mediastinum/Nodes: No enlarged mediastinal, hilar, or axillary lymph nodes. Thyroid gland, trachea, and esophagus demonstrate no significant findings. Lungs/Pleura: Trace right pleural effusion. Ground-glass opacity and mild consolidation in the right greater than left lower lobe. No pneumothorax Upper Abdomen: No  acute abnormality. Musculoskeletal: No chest wall abnormality. No acute or significant osseous findings. Review of the MIP images confirms the above findings. IMPRESSION: 1. Positive for acute pulmonary embolus involving right inter lobar pulmonary artery and multiple segmental and subsegmental branches of the right lower lobe with additional small emboli within left lower lobe segmental and subsegmental branches. No evidence for right heart strain. 2. Small right pleural effusion. Ground-glass density and mild consolidations in the right greater than left lower lobe may reflect pneumonia or possible pulmonary infarction. Critical Value/emergent results were called by telephone at the time of interpretation on 09/15/2017 at 7:16 pm to Dr. Tilden Fossa , who verbally acknowledged these results. Electronically Signed   By: Jasmine Pang M.D.   On: 09/15/2017 19:16    Procedures Procedures (including critical care time)  Medications Ordered in ED Medications  iopamidol (ISOVUE-370) 76 % injection (has no administration in time range)  morphine  4 MG/ML injection 4 mg (has no administration in time range)  iopamidol (ISOVUE-370) 76 % injection 100 mL (100 mLs Intravenous Contrast Given 09/15/17 1852)     Initial Impression / Assessment and Plan / ED Course  I have reviewed the triage vital signs and the nursing notes.  Pertinent labs & imaging results that were available during my care of the patient were reviewed by me and considered in my medical decision making (see chart for details).     Patient here for evaluation of right-sided chest pain and shortness of breath, she is about two weeks postpartum status post cesarean section and hysterectomy. She does have significant pain and tachypnea on examination. CT scan does demonstrate acute pulmonary embolism, no evidence of right heart strain. Will treat with heparin given her recent surgery, pain control. Hospitalist consulted for admission for  further management. Patient updated of findings of studies and she is in agreement with admission for further treatment.  Final Clinical Impressions(s) / ED Diagnoses   Final diagnoses:  Other acute pulmonary embolism without acute cor pulmonale [facility](HCC)    ED Discharge Orders    None       Tilden Fossaees, Tansy Lorek, MD 09/16/17 [phone]

## 2017-09-15 NOTE — H&P (Signed)
History and Physical    Isabel Lewis NWG:[id number] DOB: 05/07/1981 DOA: 09/15/2017  PCP: Patient, No Pcp Per  Patient coming from: Home  Chief Complaint: Chest pain  HPI: Isabel Lewis is a 37 y.o. female with medical history significant of recent placenta previa and emergent C-section delivery with emergent hysterectomy due to acute blood loss anemia on 09/03/2017 has been having a routine postpartum course until yesterday.  Patient reports she has not had any more bleeding vaginally since her delivery.  Baby is doing well and she is breast-feeding.  She has had some generalized swelling to both of her legs since her surgery.  She denies any fevers nausea vomiting or diarrhea.  Last night she started to have some pleuritic chest pain that is progressively got worse to the point where she could not hardly breast-feed her baby today.  It was associated with shortness of breath.  The pain is very painful with deep breathing.  Patient came to the emergency department is found to have a pulmonary emboli bilaterally.  Patient has no prior history of blood clots in her past.  Patient is being referred for admission for acute pulmonary emboli.   Review of Systems: As per HPI otherwise 10 point review of systems negative.   History reviewed. No pertinent past medical history.  Placenta previa gestational diabetes  Past Surgical History:  Procedure Laterality Date  . CESAREAN SECTION     x5     reports that she has never smoked. She has never used smokeless tobacco. She reports that she does not drink alcohol or use drugs.  No Known Allergies  History reviewed. No pertinent family history.  No blood clots in her family  Prior to Admission medications   Medication Sig Start Date End Date Taking? Authorizing Provider  ibuprofen (ADVIL) 200 MG tablet Take 600 mg by mouth every 6 (six) hours as needed.   Yes [provider]  Prenatal Vit-Fe Fumarate-FA (MULTIVITAMIN-PRENATAL) 27-0.8 MG TABS  tablet Take 1 tablet by mouth daily at 12 noon.   Yes [provider]    Physical Exam: Vitals:   09/15/17 1708 09/15/17 2000 09/15/17 2015  BP: (!) 146/92 116/77 127/79  Pulse: 89 88 91  Resp: (!) 24 (!) 30 (!) 39  Temp: 98.8 F (37.1 C)    SpO2: 98% 100% 100%  Weight: 114.3 kg (252 lb)    Height: 4\' 10"  (1.473 m)        Constitutional: NAD, calm, comfortable Vitals:   09/15/17 1708 09/15/17 2000 09/15/17 2015  BP: (!) 146/92 116/77 127/79  Pulse: 89 88 91  Resp: (!) 24 (!) 30 (!) 39  Temp: 98.8 F (37.1 C)    SpO2: 98% 100% 100%  Weight: 114.3 kg (252 lb)    Height: 4\' 10"  (1.473 m)     Eyes: PERRL, lids and conjunctivae normal ENMT: Mucous membranes are moist. Posterior pharynx clear of any exudate or lesions.Normal dentition.  Neck: normal, supple, no masses, no thyromegaly Respiratory: clear to auscultation bilaterally, no wheezing, no crackles. Normal respiratory effort. No accessory muscle use.  Cardiovascular: Regular rate and rhythm, no murmurs / rubs / gallops.  1+ extremity edema. 2+ pedal pulses. No carotid bruits.  Abdomen: no tenderness, no masses palpated. No hepatosplenomegaly. Bowel sounds positive.  Musculoskeletal: no clubbing / cyanosis. No joint deformity upper and lower extremities. Good ROM, no contractures. Normal muscle tone.  Skin: no rashes, lesions, ulcers. No induration Neurologic: CN 2-12 grossly intact. Sensation intact, DTR normal.  Strength 5/5 in all 4.  Psychiatric: Normal judgment and insight. Alert and oriented x 3. Normal mood.    Labs on Admission: I have personally reviewed following labs and imaging studies  CBC: Recent Labs  Lab 09/15/17 1712  WBC 9.2  HGB 8.6*  HCT 28.5*  MCV 90.2  PLT 260   Basic Metabolic Panel: Recent Labs  Lab 09/15/17 1712  NA 141  K 3.5  CL 107  CO2 23  GLUCOSE 132*  BUN 6  CREATININE 0.74  CALCIUM 8.8*   GFR: Estimated Creatinine Clearance: 108.9 mL/min (by C-G formula based  on SCr of 0.74 mg/dL). Liver Function Tests: No results for input(s): AST, ALT, ALKPHOS, BILITOT, PROT, ALBUMIN in the last 168 hours. No results for input(s): LIPASE, AMYLASE in the last 168 hours. No results for input(s): AMMONIA in the last 168 hours. Coagulation Profile: No results for input(s): INR, PROTIME in the last 168 hours. Cardiac Enzymes: No results for input(s): CKTOTAL, CKMB, CKMBINDEX, TROPONINI in the last 168 hours. BNP (last 3 results) No results for input(s): PROBNP in the last 8760 hours. HbA1C: No results for input(s): HGBA1C in the last 72 hours. CBG: No results for input(s): GLUCAP in the last 168 hours. Lipid Profile: No results for input(s): CHOL, HDL, LDLCALC, TRIG, CHOLHDL, LDLDIRECT in the last 72 hours. Thyroid Function Tests: No results for input(s): TSH, T4TOTAL, FREET4, T3FREE, THYROIDAB in the last 72 hours. Anemia Panel: No results for input(s): VITAMINB12, FOLATE, FERRITIN, TIBC, IRON, RETICCTPCT in the last 72 hours. Urine analysis: No results found for: COLORURINE, APPEARANCEUR, LABSPEC, PHURINE, GLUCOSEU, HGBUR, BILIRUBINUR, KETONESUR, PROTEINUR, UROBILINOGEN, NITRITE, LEUKOCYTESUR Sepsis Labs: !!!!!!!!!!!!!!!!!!!!!!!!!!!!!!!!!!!!!!!!!!!! @LABRCNTIP (procalcitonin:4,lacticidven:4) )No results found for this or any previous visit (from the past 240 hour(s)).   Radiological Exams on Admission: Ct Angio Chest Pe W And/or Wo Contrast  Result Date: 09/15/2017 CLINICAL DATA:  Right-sided chest pain with shortness of breath EXAM: CT ANGIOGRAPHY CHEST WITH CONTRAST TECHNIQUE: Multidetector CT imaging of the chest was performed using the standard protocol during bolus administration of intravenous contrast. Multiplanar CT image reconstructions and MIPs were obtained to evaluate the vascular anatomy. CONTRAST:  ISOVUE-370 IOPAMIDOL (ISOVUE-370) INJECTION 76% COMPARISON:  None. FINDINGS: Cardiovascular: Satisfactory opacification of the pulmonary arteries  to the segmental level. Filling defect within the right inter lobar pulmonary artery and multiple right lower lobe segmental and subsegmental branches. Additional smaller emboli within left lower lobe segmental and subsegmental branches. RV LV ratio is normal. There is mild cardiomegaly. Nonaneurysmal aorta. No significant pericardial effusion. Mediastinum/Nodes: No enlarged mediastinal, hilar, or axillary lymph nodes. Thyroid gland, trachea, and esophagus demonstrate no significant findings. Lungs/Pleura: Trace right pleural effusion. Ground-glass opacity and mild consolidation in the right greater than left lower lobe. No pneumothorax Upper Abdomen: No acute abnormality. Musculoskeletal: No chest wall abnormality. No acute or significant osseous findings. Review of the MIP images confirms the above findings. IMPRESSION: 1. Positive for acute pulmonary embolus involving right inter lobar pulmonary artery and multiple segmental and subsegmental branches of the right lower lobe with additional small emboli within left lower lobe segmental and subsegmental branches. No evidence for right heart strain. 2. Small right pleural effusion. Ground-glass density and mild consolidations in the right greater than left lower lobe may reflect pneumonia or possible pulmonary infarction. Critical Value/emergent results were called by telephone at the time of interpretation on 09/15/2017 at 7:16 pm to Dr. Tilden Fossa , who verbally acknowledged these results. Electronically Signed   By: Adrian Prows.D.  On: 09/15/2017 19:16    EKG: Independently reviewed.  Normal sinus rhythm no acute changes Old chart reviewed Case discussed with Dr. Pecola Leisure in the ED   Assessment/Plan 37 year old female status post emergent C-section and hysterectomy due to acute blood loss anemia on 09/03/2017 comes in with acute onset of right-sided chest pain and shortness of breath found to have bilateral pulmonary emboli Principal Problem:    Pulmonary embolism, delivered, with postpartum complication-provoked placed on heparin drip.  Will obtain bilateral lower extremity ultrasound of legs to rule out DVT.  Patient is currently breast-feeding.  Will provide her with IV Toradol a couple of doses to help with her pain.  Place her on Percocet as needed.  Patient afebrile with normal vital signs and O2 sats are normal.  She would likely be here for 24-48 hours.  She is quite a bit of pain right now.  IV Toradol should help.  Patient will need to be switched to either Coumadin or Lovenox while breast-feeding.  Active Problems:   Anterior placenta previa-noted status post hysterectomy   Antepartum multigravida of advanced maternal age-noted   S/P hysterectomy-noted   Chest pain-due to PE as above Breast-feeding status-obtain a lactation consult    DVT prophylaxis: Heparin drip  code Status: Full code Family Communication: None Disposition Plan: Per day team Consults called: None Admission status: Admission   Sherrita Riederer A MD [facility]  If 7PM-7AM, please contact night-coverage [email or web address] Password [facility]  09/15/2017, 8:43 PM

## 2017-09-16 ENCOUNTER — Inpatient Hospital Stay (HOSPITAL_COMMUNITY): Payer: Medicaid Other

## 2017-09-16 ENCOUNTER — Other Ambulatory Visit: Payer: Self-pay

## 2017-09-16 DIAGNOSIS — O8823 Thromboembolism in the puerperium: Secondary | ICD-10-CM

## 2017-09-16 DIAGNOSIS — O8601 Infection of obstetric surgical wound, superficial incisional site: Secondary | ICD-10-CM | POA: Clinically undetermined

## 2017-09-16 DIAGNOSIS — M7989 Other specified soft tissue disorders: Secondary | ICD-10-CM

## 2017-09-16 LAB — BASIC METABOLIC PANEL
Anion gap: 9 (ref 5–15)
BUN: 5 mg/dL — AB (ref 6–20)
CO2: 24 mmol/L (ref 22–32)
Calcium: 8.4 mg/dL — ABNORMAL LOW (ref 8.9–10.3)
Chloride: 106 mmol/L (ref 101–111)
Creatinine, Ser: 0.65 mg/dL (ref 0.44–1.00)
GFR calc Af Amer: >60 mL/min (ref >60)
GFR calc non Af Amer: >60 mL/min (ref >60)
Glucose, Bld: 99 mg/dL (ref 65–99)
POTASSIUM: 3.6 mmol/L (ref 3.5–5.1)
Sodium: 139 mmol/L (ref 135–145)

## 2017-09-16 LAB — MRSA PCR SCREENING: MRSA by PCR: NEGATIVE

## 2017-09-16 LAB — CBC
HEMATOCRIT: 25.4 % — AB (ref 36.0–46.0)
Hemoglobin: 8 g/dL — ABNORMAL LOW (ref 12.0–15.0)
MCH: 28.1 pg (ref 26.0–34.0)
MCHC: 31.5 g/dL (ref 30.0–36.0)
MCV: 89.1 fL (ref 78.0–100.0)
PLATELETS: 246 10*3/uL (ref 150–400)
RBC: 2.85 MIL/uL — ABNORMAL LOW (ref 3.87–5.11)
RDW: 16.8 % — AB (ref 11.5–15.5)
WBC: 8.7 10*3/uL (ref 4.0–10.5)

## 2017-09-16 LAB — HEPARIN LEVEL (UNFRACTIONATED)

## 2017-09-16 MED ORDER — ENOXAPARIN SODIUM 100 MG/ML ~~LOC~~ SOLN: 1.0000 mg/kg | Freq: Two times a day (BID) | SUBCUTANEOUS | 0 refills | Status: DC

## 2017-09-16 MED ORDER — ENOXAPARIN SODIUM 120 MG/0.8ML ~~LOC~~ SOLN
1.0000 mg/kg | Freq: Two times a day (BID) | SUBCUTANEOUS | Status: DC
  Administered 2017-09-16 – 2017-09-18 (×5): 115 mg via SUBCUTANEOUS
  Filled 2017-09-16 (×5): qty 0.75

## 2017-09-16 MED ORDER — WARFARIN - PHARMACIST DOSING INPATIENT
Freq: Every day | Status: DC
Start: 2017-09-16 — End: 2017-09-18
  Administered 2017-09-17: 17:00:00

## 2017-09-16 MED ORDER — CEPHALEXIN 500 MG PO CAPS
500.0000 mg | ORAL_CAPSULE | Freq: Two times a day (BID) | ORAL | Status: AC
  Administered 2017-09-16 – 2017-09-18 (×5): 500 mg via ORAL
  Filled 2017-09-16 (×5): qty 1

## 2017-09-16 MED ORDER — HEPARIN BOLUS VIA INFUSION
1500.0000 [IU] | Freq: Once | INTRAVENOUS | Status: AC
  Administered 2017-09-16: 1500 [IU] via INTRAVENOUS
  Filled 2017-09-16: qty 1500

## 2017-09-16 MED ORDER — WARFARIN SODIUM 7.5 MG PO TABS
7.5000 mg | ORAL_TABLET | Freq: Once | ORAL | Status: AC
  Administered 2017-09-16: 7.5 mg via ORAL
  Filled 2017-09-16: qty 1

## 2017-09-16 NOTE — Progress Notes (Signed)
Bilateral lower extremity venous duplex has been completed. Negative for DVT.  09/16/17 11:53 AM Olen CordialGreg Marquette Blodgett RVT

## 2017-09-16 NOTE — Consult Note (Signed)
Reason for Consult: Wound evaluation Referring Physician: [name] is an 37 y.o. female [id number] who s/p c hyst on 09/03/17 at [facility] d/t placenta increta. Pt's post p course was unremarkable and was discharged home on POD # 4. She reports doing well until yesterday when she developed chest pain. Presented to [facility] ER and was Dx with acute PE. We were asked to see the pt d/t concern for incision infection. Pt had a midline incision closed subQ and Dermabond. Pt reports seeing a purulent area about the size of a quarter at the top her incision. She denies any fever or chills but subjective has been warm. No bowel or bladder dysfunction   History reviewed. No pertinent past medical history.  Past Surgical History:  Procedure Laterality Date  . CESAREAN SECTION     x5    History reviewed. No pertinent family history.  Social History:  reports that she has never smoked. She has never used smokeless tobacco. She reports that she does not drink alcohol or use drugs.  Allergies: No Known Allergies  Medications: I have reviewed the patient's current medications.  Review of Systems  Constitutional: Negative.   Cardiovascular: Positive for chest pain.  Gastrointestinal: Negative.   Genitourinary: Negative.     Blood pressure 122/69, pulse 89, temperature 99.2 F (37.3 C), temperature source Oral, resp. rate (!) 37, height _0  (1.473 m), weight 112.7 kg (248 lb 6.4 oz), last menstrual period 01/05/2017, SpO2 97 %, currently breastfeeding. Physical Exam  Constitutional: She appears well-developed and well-nourished.  Cardiovascular: Normal rate and regular rhythm.  Respiratory: Effort normal and breath sounds normal.  GI:  Soft, + BS, midline incision with 1 x 2 cm area with purulent discharge under the Dermabond  Dermabond removed from this area and discharged cleaned. Small 2 x 2 mm separation of incision noted. Unable to express any additional discharge from  incision Incision otherwise intact and no evidence of infection  Genitourinary:  Genitourinary Comments: deferred    Results for orders placed or performed during the hospital encounter of 09/15/17 (from the past 48 hour(s))  Basic metabolic panel     Status: Abnormal   Collection Time: 09/15/17  5:12 PM  Result Value Ref Range   Sodium 141 135 - 145 mmol/L   Potassium 3.5 3.5 - 5.1 mmol/L   Chloride 107 101 - 111 mmol/L   CO2 23 22 - 32 mmol/L   Glucose, Bld 132 (H) 65 - 99 mg/dL   BUN 6 6 - 20 mg/dL   Creatinine, Ser 0.74 0.44 - 1.00 mg/dL   Calcium 8.8 (L) 8.9 - 10.3 mg/dL   GFR calc non Af Amer >60 >60 mL/min   GFR calc Af Amer >60 >60 mL/min    Comment: (NOTE) The eGFR has been calculated using the CKD EPI equation. This calculation has not been validated in all clinical situations. eGFR's persistently <60 mL/min signify possible Chronic Kidney Disease.    Anion gap 11 5 - 15    Comment: Performed at [facility] [address], [place] [address]  CBC     Status: Abnormal   Collection Time: 09/15/17  5:12 PM  Result Value Ref Range   WBC 9.2 4.0 - 10.5 K/uL   RBC 3.16 (L) 3.87 - 5.11 MIL/uL   Hemoglobin 8.6 (L) 12.0 - 15.0 g/dL   HCT 28.5 (L) 36.0 - 46.0 %   MCV 90.2 78.0 - 100.0 fL   MCH 27.2  26.0 - 34.0 pg   MCHC 30.2 30.0 - 36.0 g/dL   RDW 16.6 (H) 11.5 - 15.5 %   Platelets 260 150 - 400 K/uL    Comment: Performed at [facility], [place] [address], [place], [place] [address]  I-stat troponin, ED     Status: None   Collection Time: 09/15/17  5:24 PM  Result Value Ref Range   Troponin i, poc 0.02 0.00 - 0.08 ng/mL   Comment 3            Comment: Due to the release kinetics of cTnI, a negative result within the first hours of the onset of symptoms does not rule out myocardial infarction with certainty. If myocardial infarction is still suspected, repeat the test at appropriate intervals.   MRSA PCR Screening     Status: None   Collection  Time: 09/15/17 10:50 PM  Result Value Ref Range   MRSA by PCR NEGATIVE NEGATIVE    Comment:        The GeneXpert MRSA Assay (FDA approved for NASAL specimens only), is one component of a comprehensive MRSA colonization surveillance program. It is not intended to diagnose MRSA infection nor to guide or monitor treatment for MRSA infections. Performed at [facility], [place] [address], [place], [place] [address]   Heparin level (unfractionated)     Status: Abnormal   Collection Time: 09/16/17  3:11 AM  Result Value Ref Range   Heparin Unfractionated <0.10 (L) 0.30 - 0.70 IU/mL    Comment:        IF HEPARIN RESULTS ARE BELOW EXPECTED VALUES, AND PATIENT DOSAGE HAS BEEN CONFIRMED, SUGGEST FOLLOW UP TESTING OF ANTITHROMBIN III LEVELS. Performed at [facility], [place] [address] [place], [place] [address]   CBC     Status: Abnormal   Collection Time: 09/16/17  3:11 AM  Result Value Ref Range   WBC 8.7 4.0 - 10.5 K/uL   RBC 2.85 (L) 3.87 - 5.11 MIL/uL   Hemoglobin 8.0 (L) 12.0 - 15.0 g/dL   HCT 25.4 (L) 36.0 - 46.0 %   MCV 89.1 78.0 - 100.0 fL   MCH 28.1 26.0 - 34.0 pg   MCHC 31.5 30.0 - 36.0 g/dL   RDW 16.8 (H) 11.5 - 15.5 %   Platelets 246 150 - 400 K/uL    Comment: Performed at [facility], [place] [address], [place], [place] [address]  Basic metabolic panel     Status: Abnormal   Collection Time: 09/16/17  3:11 AM  Result Value Ref Range   Sodium 139 135 - 145 mmol/L   Potassium 3.6 3.5 - 5.1 mmol/L   Chloride 106 101 - 111 mmol/L   CO2 24 22 - 32 mmol/L   Glucose, Bld 99 65 - 99 mg/dL   BUN 5 (L) 6 - 20 mg/dL   Creatinine, Ser 0.65 0.44 - 1.00 mg/dL   Calcium 8.4 (L) 8.9 - 10.3 mg/dL   GFR calc non Af Amer >60 >60 mL/min   GFR calc Af Amer >60 >60 mL/min    Comment: (NOTE) The eGFR has been calculated using the CKD EPI equation. This calculation has not been validated in all clinical situations. eGFR's persistently <60 mL/min signify possible Chronic  Kidney Disease.    Anion gap 9 5 - 15    Comment: Performed at [facility] [address] [place], [place] [address]    Ct Angio Chest Pe W And/or  Wo Contrast  Result Date: 09/15/2017 CLINICAL DATA:  Right-sided chest pain with shortness of breath EXAM: CT ANGIOGRAPHY CHEST WITH CONTRAST TECHNIQUE: Multidetector CT imaging of the chest was performed using the standard protocol during bolus administration of intravenous contrast. Multiplanar CT image reconstructions and MIPs were obtained to evaluate the vascular anatomy. CONTRAST:  164m ISOVUE-370 IOPAMIDOL (ISOVUE-370) INJECTION 76% COMPARISON:  None. FINDINGS: Cardiovascular: Satisfactory opacification of the pulmonary arteries to the segmental level. Filling defect within the right inter lobar pulmonary artery and multiple right lower lobe segmental and subsegmental branches. Additional smaller emboli within left lower lobe segmental and subsegmental branches. RV LV ratio is normal. There is mild cardiomegaly. Nonaneurysmal aorta. No significant pericardial effusion. Mediastinum/Nodes: No enlarged mediastinal, hilar, or axillary lymph nodes. Thyroid gland, trachea, and esophagus demonstrate no significant findings. Lungs/Pleura: Trace right pleural effusion. Ground-glass opacity and mild consolidation in the right greater than left lower lobe. No pneumothorax Upper Abdomen: No acute abnormality. Musculoskeletal: No chest wall abnormality. No acute or significant osseous findings. Review of the MIP images confirms the above findings. IMPRESSION: 1. Positive for acute pulmonary embolus involving right inter lobar pulmonary artery and multiple segmental and subsegmental branches of the right lower lobe with additional small emboli within left lower lobe segmental and subsegmental branches. No evidence for right heart strain. 2. Small right pleural effusion. Ground-glass density and mild consolidations in the right greater than left lower lobe may  reflect pneumonia or possible pulmonary infarction. Critical Value/emergent results were called by telephone at the time of interpretation on 09/15/2017 at 7:16 pm to Dr. EQuintella Reichert, who verbally acknowledged these results. Electronically Signed   By: KDonavan FoilM.D.   On: 09/15/2017 19:16    Assessment/Plan: Superficial area of incisional cellulitis POD # 13 from c hyst Acute PE  Pt has been instructed on wound care daily. Would start Keflex 500 mg po bid x 5 days as a precaution. Wound should heal without problems. Pt has appt next week with OB/GYN and GYN Onc for post op appt. Recommend keeping this appt. Lovenox and coumadin as per IM for her acute PE.   On behave of [facility] [employer] we would like to thank Dr DShanon Browfor allowing [place] participate in Ms Redondo's care.  Will sign off now but are available at [phone] for any questions or concerns. Please do not share this number with pt or family.    MChancy Milroy4/08/2017

## 2017-09-16 NOTE — Progress Notes (Signed)
Temp of 101.3. Pt has been running temps. Dr. Caleb PoppNettey notified on rounds this am. Tylenol given. Will continue to monitor

## 2017-09-16 NOTE — Discharge Instructions (Addendum)
Enoxaparin injection °What is this medicine? °ENOXAPARIN (ee nox a PA rin) is used after knee, hip, or abdominal surgeries to prevent blood clotting. It is also used to treat existing blood clots in the lungs or in the veins. °This medicine may be used for other purposes; ask your health care provider or pharmacist if you have questions. °COMMON BRAND NAME(S): Lovenox °What should I tell my health care provider before I take this medicine? °They need to know if you have any of these conditions: °-bleeding disorders, hemorrhage, or hemophilia °-infection of the heart or heart valves °-kidney or liver disease °-previous stroke °-prosthetic heart valve °-recent surgery or delivery of a baby °-ulcer in the stomach or intestine, diverticulitis, or other bowel disease °-an unusual or allergic reaction to enoxaparin, heparin, pork or pork products, other medicines, foods, dyes, or preservatives °-pregnant or trying to get pregnant °-breast-feeding °How should I use this medicine? °This medicine is for injection under the skin. It is usually given by a health-care professional. You or a family member may be trained on how to give the injections. If you are to give yourself injections, make sure you understand how to use the syringe, measure the dose if necessary, and give the injection. To avoid bruising, do not rub the site where this medicine has been injected. Do not take your medicine more often than directed. Do not stop taking except on the advice of your doctor or health care professional. °Make sure you receive a puncture-resistant container to dispose of the needles and syringes once you have finished with them. Do not reuse these items. Return the container to your doctor or health care professional for proper disposal. °Talk to your pediatrician regarding the use of this medicine in children. Special care may be needed. °Overdosage: If you think you have taken too much of this medicine contact a poison control  center or emergency room at once. °NOTE: This medicine is only for you. Do not share this medicine with others. °What if I miss a dose? °If you miss a dose, take it as soon as you can. If it is almost time for your next dose, take only that dose. Do not take double or extra doses. °What may interact with this medicine? °-aspirin and aspirin-like medicines °-certain medicines that treat or prevent blood clots °-dipyridamole °-NSAIDs, medicines for pain and inflammation, like ibuprofen or naproxen °This list may not describe all possible interactions. Give your health care provider a list of all the medicines, herbs, non-prescription drugs, or dietary supplements you use. Also tell them if you smoke, drink alcohol, or use illegal drugs. Some items may interact with your medicine. °What should I watch for while using this medicine? °Visit your doctor or health care professional for regular checks on your progress. Your condition will be monitored carefully while you are receiving this medicine. °Notify your doctor or health care professional and seek emergency treatment if you develop breathing problems; changes in vision; chest pain; severe, sudden headache; pain, swelling, warmth in the leg; trouble speaking; sudden numbness or weakness of the face, arm, or leg. These can be signs that your condition has gotten worse. °If you are going to have surgery, tell your doctor or health care professional that you are taking this medicine. °Do not stop taking this medicine without first talking to your doctor. Be sure to refill your prescription before you run out of medicine. °Avoid sports and activities that might cause injury while you are using this medicine. Severe   falls or injuries can cause unseen bleeding. Be careful when using sharp tools or knives. Consider using an Neurosurgeon. Take special care brushing or flossing your teeth. Report any injuries, bruising, or red spots on the skin to your doctor or health care  professional. What side effects may I notice from receiving this medicine? Side effects that you should report to your doctor or health care professional as soon as possible: -allergic reactions like skin rash, itching or hives, swelling of the face, lips, or tongue -feeling faint or lightheaded, falls -signs and symptoms of bleeding such as bloody or black, tarry stools; red or dark-brown urine; spitting up blood or brown material that looks like coffee grounds; red spots on the skin; unusual bruising or bleeding from the eye, gums, or nose Side effects that usually do not require medical attention (report to your doctor or health care professional if they continue or are bothersome): -pain, redness, or irritation at site where injected This list may not describe all possible side effects. Call your doctor for medical advice about side effects. You may report side effects to FDA at 1-800-FDA-1088. Where should I keep my medicine? Keep out of the reach of children. Store at room temperature between 15 and 30 degrees C (59 and 86 degrees F). Do not freeze. If your injections have been specially prepared, you may need to store them in the refrigerator. Ask your pharmacist. Throw away any unused medicine after the expiration date. NOTE: This sheet is a summary. It may not cover all possible information. If you have questions about this medicine, talk to your doctor, pharmacist, or health care provider.  2018 Elsevier/Gold Standard (2013-10-04 16:06:21)   Information on my medicine - Coumadin   (Warfarin)  This medication education was reviewed with me or my healthcare representative as part of my discharge preparation.   Why was Coumadin prescribed for you? Coumadin was prescribed for you because you have a blood clot or a medical condition that can cause an increased risk of forming blood clots. Blood clots can cause serious health problems by blocking the flow of blood to the heart, lung, or  brain. Coumadin can prevent harmful blood clots from forming. As a reminder your indication for Coumadin is:   Pulmonary Embolism Treatment  What test will check on my response to Coumadin? While on Coumadin (warfarin) you will need to have an INR test regularly to ensure that your dose is keeping you in the desired range. The INR (international normalized ratio) number is calculated from the result of the laboratory test called prothrombin time (PT).  If an INR APPOINTMENT HAS NOT ALREADY BEEN MADE FOR YOU please schedule an appointment to have this lab work done by your health care provider within 7 days. Your INR goal is a number between:  2 to 3  What  do you need to  know  About  COUMADIN? Take Coumadin (warfarin) exactly as prescribed by your healthcare provider about the same time each day.  DO NOT stop taking without talking to the doctor who prescribed the medication.  Stopping without other blood clot prevention medication to take the place of Coumadin may increase your risk of developing a new clot or stroke.  Get refills before you run out.  What do you do if you miss a dose? If you miss a dose, take it as soon as you remember on the same day then continue your regularly scheduled regimen the next day.  Do not take two  doses of Coumadin at the same time.  Important Safety Information A possible side effect of Coumadin (Warfarin) is an increased risk of bleeding. You should call your healthcare provider right away if you experience any of the following: ? Bleeding from an injury or your nose that does not stop. ? Unusual colored urine (red or dark brown) or unusual colored stools (red or black). ? Unusual bruising for unknown reasons. ? A serious fall or if you hit your head (even if there is no bleeding).  Some foods or medicines interact with Coumadin (warfarin) and might alter your response to warfarin. To help avoid this: ? Eat a balanced diet, maintaining a consistent amount of  Vitamin K. ? Notify your provider about major diet changes you plan to make. ? Avoid alcohol or limit your intake to 1 drink for women and 2 drinks for men per day. (1 drink is 5 oz. wine, 12 oz. beer, or 1.5 oz. liquor.)  Make sure that ANY health care provider who prescribes medication for you knows that you are taking Coumadin (warfarin).  Also make sure the healthcare provider who is monitoring your Coumadin knows when you have started a new medication including herbals and non-prescription products.  Coumadin (Warfarin)  Major Drug Interactions  Increased Warfarin Effect Decreased Warfarin Effect  Alcohol (large quantities) Antibiotics (esp. Septra/Bactrim, Flagyl, Cipro) Amiodarone (Cordarone) Aspirin (ASA) Cimetidine (Tagamet) Megestrol (Megace) NSAIDs (ibuprofen, naproxen, etc.) Piroxicam (Feldene) Propafenone (Rythmol SR) Propranolol (Inderal) Isoniazid (INH) Posaconazole (Noxafil) Barbiturates (Phenobarbital) Carbamazepine (Tegretol) Chlordiazepoxide (Librium) Cholestyramine (Questran) Griseofulvin Oral Contraceptives Rifampin Sucralfate (Carafate) Vitamin K   Coumadin (Warfarin) Major Herbal Interactions  Increased Warfarin Effect Decreased Warfarin Effect  Garlic Ginseng Ginkgo biloba Coenzyme Q10 Green tea St. Johns wort    Coumadin (Warfarin) FOOD Interactions  Eat a consistent number of servings per week of foods HIGH in Vitamin K (1 serving =  cup)  Collards (cooked, or boiled & drained) Kale (cooked, or boiled & drained) Mustard greens (cooked, or boiled & drained) Parsley *serving size only =  cup Spinach (cooked, or boiled & drained) Swiss chard (cooked, or boiled & drained) Turnip greens (cooked, or boiled & drained)  Eat a consistent number of servings per week of foods MEDIUM-HIGH in Vitamin K (1 serving = 1 cup)  Asparagus (cooked, or boiled & drained) Broccoli (cooked, boiled & drained, or raw & chopped) Brussel sprouts (cooked, or  boiled & drained) *serving size only =  cup Lettuce, raw (green leaf, endive, romaine) Spinach, raw Turnip greens, raw & chopped   These websites have more information on Coumadin (warfarin):  http://www.king-russell.com/www.coumadin.com; https://www.hines.net/www.ahrq.gov/consumer/coumadin.htm;      Pulmonary Embolism A pulmonary embolism (PE) is a sudden blockage or decrease of blood flow in one lung or both lungs. Most blockages come from a blood clot that forms in a lower leg, thigh, or arm vein (deep vein thrombosis, DVT) and travels to the lungs. A clot is blood that has thickened into a gel or solid. PE is a dangerous and life-threatening condition that needs to be treated right away. What are the causes? This condition is usually caused by a blood clot that forms in a vein and moves to the lungs. In rare cases, it may be caused by air, fat, part of a tumor, or other tissue that moves through the veins and into the lungs. What increases the risk? The following factors may make you more likely to develop this condition:  Having DVT or a history of DVT.  Being older than age 11.  Personal or family history of blood clots or blood clotting disease.  Major or lengthy surgery.  Orthopedic surgery, especially hip or knee replacement.  Traumatic injury, such as breaking a hip or leg.  Spinal cord injury.  Stroke.  Taking medicines that contain estrogen. These include birth control pills and hormone replacement therapy.  Long-term (chronic) lung or heart disease.  Cancer and chemotherapy.  Having a central venous catheter.  Pregnancy and the period after delivery.  What are the signs or symptoms? Symptoms of this condition usually start suddenly and include:  Shortness of breath while active or at rest.  Coughing or coughing up blood or blood-tinged mucus.  Chest pain that is often worse with deep breaths.  Rapid or irregular heartbeat.  Feeling light-headed or dizzy.  Fainting.  Feeling  anxious.  Sweating.  Pain and swelling in a leg. This is a symptom of DVT, which can lead to PE.  How is this diagnosed? This condition may be diagnosed based on:  Your medical history.  A physical exam.  Blood tests to check blood oxygen level and how well your blood clots, and a D-dimer blood test, which checks your blood for a substance that is released when a blood clot breaks apart.  CT pulmonary angiogram. This test checks blood flow in and around your lungs.  Ventilation-perfusion scan, also called a lung VQ scan. This test measures air flow and blood flow to the lungs.  Ultrasound of the legs to look for blood clots.  How is this treated? Treatment for this conditions depends on many factors, such as the cause of your PE, your risk for bleeding or developing more clots, and other medical conditions you have. Treatment aims to remove, dissolve, or stop blood clots from forming or growing larger. Treatment may include:  Blood thinning medicines (anticoagulants) to stop clots from forming or growing. These medicines may be given as a pill, as an injection, or through an IV tube (infusion).  Medicines that dissolve clots (thrombolytics).  A procedure in which a flexible tube is used to remove a blood clot (embolectomy) or deliver medicine to destroy it (catheter-directed thrombolysis).  A procedure in which a filter is inserted into a large vein that carries blood to the heart (inferior vena cava). This filter (vena cava filter) catches blood clots before they reach the lungs.  Surgery to remove the clot (surgical embolectomy). This is rare.  You may need a combination of immediate, long-term (up to 3 months after diagnosis), and extended (more than 3 months after diagnosis) treatments. Your treatment may continue for several months (maintenance therapy). You and your health care provider will work together to choose the treatment program that is best for you. Follow these  instructions at home: If you are taking an anticoagulant medicine:  Take the medicine every day at the same time each day.  Understand what foods and drugs interact with your medicine.  Understand the side effects of this medicine, including excessive bruising or bleeding. Ask your health care provider or pharmacist about other side effects. General instructions  Take over-the-counter and prescription medicines only as told by your health care provider.  Anticoagulant medicines may cause side effects, including easy bruising and difficulty stopping bleeding. If you are prescribed an anticoagulant: ? Hold pressure over cuts for longer than usual. ? Tell your dentist and other health care providers that you are taking anticoagulants before you have any procedure that may cause bleeding. ?  Avoid contact sports. ? Be extra careful when handling sharp objects. ? Use a soft toothbrush. Floss with waxed dental floss. ? Shave with an Neurosurgeon.  Wear a medical alert bracelet or carry a medical alert card that says you have had a PE.  Ask your health care provider when you may return to your normal activities.  Talk with your health care provider about any travel plans. It is important to make sure that you are still able to take your medicine while on trips.  Keep all follow-up visits as told by your health care provider. This is important. How is this prevented? Take these actions to lower your risk of developing another PE:  Exercise regularly. Take frequent walks. For at least 30 minutes every day, engage in: ? Activity that involves moving your arms and legs. ? Activity that encourages good blood flow through your body by increasing your heart rate.  While traveling, drink plenty of water and avoid drinking alcohol. Ask your health care provider if you should wear below-the-knee compression stockings.  Avoid sitting or lying in bed for long periods of time without moving your  legs. Exercise your arms and legs every hour during long-distance travel (over 4 hours).  If you are hospitalized or have surgery, ask your health care provider about your risks and what treatments can help prevent blood clots.  Maintain a healthy weight. Ask your health care provider what weight is healthy for you.  If you are a woman who is over age 76, avoid unnecessary use of medicines that contain estrogen, including birth control pills.  Do not use any products that contain nicotine or tobacco, such as cigarettes and e-cigarettes. This is especially important if you take estrogen medicines. If you need help quitting, ask your health care provider.  See your health care provider for regular checkups. This may include blood tests and ultrasound testing on your legs to check for new blood clots.  Contact a health care provider if:  You missed a dose of your blood thinner medicine. Get help right away if:  You have new or increased pain, swelling, warmth, or redness in an arm or leg.  You have numbness or tingling in an arm or leg.  You have shortness of breath while active or at rest.  You have chest pain.  You have a rapid or irregular heartbeat.  You feel light-headed or dizzy.  You cough up blood.  You have blood in your vomit, stool, or urine.  You have a fever.  You have abdomen (abdominal) pain.  You have a severe fall or head injury.  You have a severe headache.  You have vision changes.  You cannot move your arms or legs.  You are confused or have memory loss.  You are bleeding for 10 minutes or more, even with strong pressure on the wound. These symptoms may represent a serious problem that is an emergency. Do not wait to see if the symptoms will go away. Get medical help right away. Call your local emergency services (911 in the [place] Do not drive yourself to the hospital. Summary  A pulmonary embolism (PE) is a sudden blockage or decrease of blood  flow in one lung or both lungs. PE is a dangerous and life-threatening condition that needs to be treated right away.  Having deep vein thrombosis (DVT) or a history of DVT is the most common risk factor for PE.  Treatments for this condition usually include medicines to thin  your blood (anticoagulants) or medicines to break apart blood clots (thrombolytics).  If you are prescribed blood thinners, it is important to take the medicine every single day at the same time each day.  If you have signs of PE or DVT, call your local emergency services (911 in the [place] This information is not intended to replace advice given to you by your health care provider. Make sure you discuss any questions you have with your health care provider. Document Released: 05/30/2000 Document Revised: 07/05/2016 Document Reviewed: 07/05/2016 Elsevier Interactive Patient Education  2018 [employer].

## 2017-09-16 NOTE — Progress Notes (Signed)
ANTICOAGULATION CONSULT NOTE   Pharmacy Consult for Heparin Indication: pulmonary embolus  No Known Allergies  Patient Measurements: Height: 4\' 10"  (147.3 cm) Weight: 250 lb 7.1 oz (113.6 kg) IBW/kg (Calculated) : 40.9 Heparin Dosing Weight: 70 Kg  Vital Signs: Temp: 100.7 F (38.2 C) (04/02 2344) Temp Source: Oral (04/02 2344) BP: 125/72 (04/02 2344) Pulse Rate: 93 (04/02 2344)  Labs: Recent Labs    09/15/17 1712 09/16/17 0311  HGB 8.6* 8.0*  HCT 28.5* 25.4*  PLT 260 246  HEPARINUNFRC  --  <0.10*  CREATININE 0.74  --     Estimated Creatinine Clearance: 108.5 mL/min (by C-G formula based on SCr of 0.74 mg/dL).   Assessment: 37 y/o F s/p C-section on 3/21, now found to have new onset PE on CT angio, Hgb 8, heparin level is undetectable this AM, no issues per RN.   Goal of Therapy:  Heparin level 0.3-0.7 units/ml Monitor platelets by anticoagulation protocol: Yes   Plan:  -Heparin 1500 units BOLUS-smaller bolus with recent surgery/lower Hgb -Inc heparin drip to 1450 units/hr -1100 HL -Daily CBC/HL -Monitor for bleeding   Abran DukeLedford, Jeffie Widdowson 09/16/2017,3:51 AM

## 2017-09-16 NOTE — Progress Notes (Signed)
PROGRESS NOTE  Isabel Lewis WUJ:[id number] DOB: [birth year] DOA: 09/15/2017 PCP: Patient, No Pcp Per  HPI/Recap of past 24 hours:  Isabel Lewis is a 37 y.o. year old female with medical history significant for emergent C-section and hysterectomy due to acute blood loss anemia on 3/21 in setting of placenta previa who presented on 09/15/2017 with chest pain or shortness of breath and was found to have acute bilateral pulmonary emboli.  Interval History Still having right-sided back pain, no shortness of breath, mild abdominal pain. Patient reports noticing skin discoloration at C-section wound site one day prior to admission   Assessment/Plan: Principal Problem:   Pulmonary embolism, delivered, with postpartum complication Active Problems:   Anterior placenta previa   Antepartum multigravida of advanced maternal age   S/P hysterectomy   Chest pain   #Acute bilateral PE.-Provoked in setting of recent pregnancy/c-section/hysterectomy, no evidence of right heart strain on CTA.  On heparin drip with stable hemoglobin and no signs of bleeding.  Will transition to Lovenox bridge and initiation of Coumadin today awaiting lower extremity ultrasound to assess for any DVTs. Wean oxygen  #Infection of obstetric surgical wound site-seems superficial in nature though some degree of fluctuance.  Will await obstetrics evaluation. Agreed to start keflex 500 mg PO BID x 5 days s a precaution for systemic involvement.    Code Status: Full  Family Communication: No family at bedside   Disposition Plan: medically ready for discharge; however, no safe discharge plan for lovenox script as brand name is only covered on patient's plan, case management is assisting ( awaiting authorization of script)   Consultants:  Obstetrics  Procedures:  Antimicrobials:  Keflex 4/3  Cultures:   none  Telemetry: yes. Discontinue today  DVT prophylaxis: Lovenox bridge to warfarin   Objective: Vitals:   09/15/17 2145 09/15/17 2218 09/15/17 2344 09/16/17 0412  BP: 133/82 136/89 125/72 122/80  Pulse: 84 (!) 101 93 89  Resp: (!) 25 (!) 37    Temp:  98.3 F (36.8 C) (!) 100.7 F (38.2 C) 99.6 F (37.6 C)  TempSrc:  Oral Oral Axillary  SpO2: 99% 100% 97% 99%  Weight:  113.6 kg (250 lb 7.1 oz)  112.7 kg (248 lb 6.4 oz)  Height:  4\' 10"  (1.473 m)      Intake/Output Summary (Last 24 hours) at 09/16/2017 0644 Last data filed at 09/16/2017 0500 Gross per 24 hour  Intake 587.31 ml  Output 600 ml  Net -12.69 ml   Filed Weights   09/15/17 1708 09/15/17 2218 09/16/17 0412  Weight: 114.3 kg (252 lb) 113.6 kg (250 lb 7.1 oz) 112.7 kg (248 lb 6.4 oz)    Exam:  Constitutional:normal appearing female Eyes: EOMI, anicteric, normal conjunctivae ENMT: Oropharynx with moist mucous membranes, normal dentition Cardiovascular: RRR no MRGs, 1+ pretibial pitting edema bilaterally of lower extremities Respiratory: Normal respiratory effort on 2 L nasal cannula, clear breath sounds  Abdomen: Soft, slightly tender to palpation with no guarding Skin: Fluctuant area with yellow/green discoloration above C-section surgical site, no obvious drainage Neurologic: Grossly no focal neuro deficit. Psychiatric:Appropriate affect, and mood. Mental status AAOx3  Data Reviewed: CBC: Recent Labs  Lab 09/15/17 1712 09/16/17 0311  WBC 9.2 8.7  HGB 8.6* 8.0*  HCT 28.5* 25.4*  MCV 90.2 89.1  PLT 260 246   Basic Metabolic Panel: Recent Labs  Lab 09/15/17 1712 09/16/17 0311  NA 141 139  K 3.5 3.6  CL 107 106  CO2 23 24  GLUCOSE 132*  99  BUN 6 5*  CREATININE 0.74 0.65  CALCIUM 8.8* 8.4*   GFR: Estimated Creatinine Clearance: 107.8 mL/min (by C-G formula based on SCr of 0.65 mg/dL). Liver Function Tests: No results for input(s): AST, ALT, ALKPHOS, BILITOT, PROT, ALBUMIN in the last 168 hours. No results for input(s): LIPASE, AMYLASE in the last 168 hours. No results for input(s): AMMONIA in the last 168  hours. Coagulation Profile: No results for input(s): INR, PROTIME in the last 168 hours. Cardiac Enzymes: No results for input(s): CKTOTAL, CKMB, CKMBINDEX, TROPONINI in the last 168 hours. BNP (last 3 results) No results for input(s): PROBNP in the last 8760 hours. HbA1C: No results for input(s): HGBA1C in the last 72 hours. CBG: No results for input(s): GLUCAP in the last 168 hours. Lipid Profile: No results for input(s): CHOL, HDL, LDLCALC, TRIG, CHOLHDL, LDLDIRECT in the last 72 hours. Thyroid Function Tests: No results for input(s): TSH, T4TOTAL, FREET4, T3FREE, THYROIDAB in the last 72 hours. Anemia Panel: No results for input(s): VITAMINB12, FOLATE, FERRITIN, TIBC, IRON, RETICCTPCT in the last 72 hours. Urine analysis: No results found for: COLORURINE, APPEARANCEUR, LABSPEC, PHURINE, GLUCOSEU, HGBUR, BILIRUBINUR, KETONESUR, PROTEINUR, UROBILINOGEN, NITRITE, LEUKOCYTESUR Sepsis Labs: @LABRCNTIP (procalcitonin:4,lacticidven:4)  ) Recent Results (from the past 240 hour(s))  MRSA PCR Screening     Status: None   Collection Time: 09/15/17 10:50 PM  Result Value Ref Range Status   MRSA by PCR NEGATIVE NEGATIVE Final    Comment:        The GeneXpert MRSA Assay (FDA approved for NASAL specimens only), is one component of a comprehensive MRSA colonization surveillance program. It is not intended to diagnose MRSA infection nor to guide or monitor treatment for MRSA infections. Performed at [facility], [address], [place], [place] [address]       Studies: Ct Angio Chest Pe W And/or Wo Contrast  Result Date: 09/15/2017 CLINICAL DATA:  Right-sided chest pain with shortness of breath EXAM: CT ANGIOGRAPHY CHEST WITH CONTRAST TECHNIQUE: Multidetector CT imaging of the chest was performed using the standard protocol during bolus administration of intravenous contrast. Multiplanar CT image reconstructions and MIPs were obtained to evaluate the vascular anatomy. CONTRAST:   ISOVUE-370 IOPAMIDOL (ISOVUE-370) INJECTION 76% COMPARISON:  None. FINDINGS: Cardiovascular: Satisfactory opacification of the pulmonary arteries to the segmental level. Filling defect within the right inter lobar pulmonary artery and multiple right lower lobe segmental and subsegmental branches. Additional smaller emboli within left lower lobe segmental and subsegmental branches. RV LV ratio is normal. There is mild cardiomegaly. Nonaneurysmal aorta. No significant pericardial effusion. Mediastinum/Nodes: No enlarged mediastinal, hilar, or axillary lymph nodes. Thyroid gland, trachea, and esophagus demonstrate no significant findings. Lungs/Pleura: Trace right pleural effusion. Ground-glass opacity and mild consolidation in the right greater than left lower lobe. No pneumothorax Upper Abdomen: No acute abnormality. Musculoskeletal: No chest wall abnormality. No acute or significant osseous findings. Review of the MIP images confirms the above findings. IMPRESSION: 1. Positive for acute pulmonary embolus involving right inter lobar pulmonary artery and multiple segmental and subsegmental branches of the right lower lobe with additional small emboli within left lower lobe segmental and subsegmental branches. No evidence for right heart strain. 2. Small right pleural effusion. Ground-glass density and mild consolidations in the right greater than left lower lobe may reflect pneumonia or possible pulmonary infarction. Critical Value/emergent results were called by telephone at the time of interpretation on 09/15/2017 at 7:16 pm to Dr. Tilden Fossa , who verbally acknowledged these results. Electronically  Signed   By: Jasmine PangKim  Fujinaga M.D.   On: 09/15/2017 19:16    Scheduled Meds: . docusate sodium  100 mg Oral BID  . iopamidol      . multivitamin with minerals  1 tablet Oral Q1200  . sodium chloride flush  3 mL Intravenous Q12H    Continuous Infusions: . sodium chloride    . heparin 1,450 Units/hr  (09/16/17 0357)     LOS: 1 day     Laverna PeaceShayla D Dawan Farney, MD [facility] Pager [phone]  If 7PM-7AM, please contact night-coverage [email or web address] Password [email or web address] 09/16/2017, 6:44 AM

## 2017-09-16 NOTE — Care Management Note (Addendum)
Case Management Note  Patient Details  Name: Isabel Lewis MRN: [id number] Date of Birth: [birth year]  Subjective/Objective: Pt presented for Chest Pain- positive for Bilateral Pulmonary Emboli.Plan for home on Lovenox Injections. [facility]                  Action/Plan: CM did call several pharmacies in regards to Medicaid- Brand Lovenox only covers. Not in stock at the pharmacies called. CM did ask MD to send Rx in for pharmacy to order. [facility] did [facility] [employer]. Hopefully Rx can be ordered and in for tomorrow. No further needs from CM at this time.   Expected Discharge Date:                  Expected Discharge Plan:  Home/Self Care  In-House Referral:  NA  Discharge planning Services  CM Consult, Medication Assistance  Post Acute Care Choice:  NA Choice offered to:  NA  DME Arranged:  N/A DME Agency:  NA  HH Arranged:  NA HH Agency:  NA  Status of Service:  Completed, signed off  If discussed at Long Length of Stay Meetings, dates discussed:    Additional Comments: 1650 09-18-17 Tomi BambergerBrenda Graves-Bigelow, RN,BSN [phone] CM [facility] MD to follow v/s and Oxygen saturations overnight. CM to assess in am in regards to 02. No further needs at this time.    1557 09-18-17 Tomi BambergerBrenda Graves-Bigelow, RN,BSN [phone] CM [facility] [place] and PT/INR will not be able to be done Monday Morning-this office will not follow the results. [facility] on [address] to see if Office can check PT/INR. Dr. Debroah LoopArnold will not take patient for lab referral - [facility] and they are trying to schedule the patient for follow up for PCP/ PT/INR- awaiting call back. CM did speak with RN and patient's 02 saturations decreased. Pt does not have a qualifying diagnosis for Medicaid to  pay for DME 02. CM will make MD aware. No further needs from CM at this time.    1640 09-16-17 Tomi BambergerBrenda Graves-Bigelow, RN,BSN [phone] [facility] did [facility] and [facility] number. [facility]. Pt should be ready for d/c 09-17-17. No further needs.  Gala LewandowskyGraves-Bigelow, Masaru Chamberlin Kaye, RN 09/16/2017, 2:47 PM

## 2017-09-16 NOTE — Progress Notes (Signed)
ANTICOAGULATION CONSULT NOTE   Pharmacy Consult for Heparin> enoxaparin + warfarin Indication: pulmonary embolus  No Known Allergies  Patient Measurements: Height: 4\' 10"  (147.3 cm) Weight: 248 lb 6.4 oz (112.7 kg) IBW/kg (Calculated) : 40.9 Heparin Dosing Weight: 70 Kg  Vital Signs: Temp: 99.5 F (37.5 C) (04/03 0742) Temp Source: Oral (04/03 0742) BP: 125/88 (04/03 0742) Pulse Rate: 89 (04/03 0412)  Labs: Recent Labs    09/15/17 1712 09/16/17 0311  HGB 8.6* 8.0*  HCT 28.5* 25.4*  PLT 260 246  HEPARINUNFRC  --  <0.10*  CREATININE 0.74 0.65    Estimated Creatinine Clearance: 107.8 mL/min (by C-G formula based on SCr of 0.65 mg/dL).   Assessment:  37 y/o F s/p C-section on 3/21, now found to have new  PE on CT angio, Hgb 8, still post partum vaginal bleeding per MD.  Mora ApplStarted on heparin drip overnight but plan to transition to enoxaprin bridge to warfarin.   Goal of Therapy:  Anti-Xa level 0.6-1.2 drawn 4hr after dose INR 2-3 Monitor platelets by anticoagulation protocol: Yes   Plan:  Stop heparin Enoxaparin 1mg /kg = 115mg  q12h Warfarin 7.5mg  x1 tonight -Daily CBC/INR -Monitor for bleeding  Leota SauersLisa Tjay Velazquez Pharm.D. CPP, BCPS Clinical Pharmacist [phone] 09/16/2017 11:00 AM

## 2017-09-17 ENCOUNTER — Other Ambulatory Visit (HOSPITAL_COMMUNITY): Payer: Medicaid Other

## 2017-09-17 DIAGNOSIS — R6510 Systemic inflammatory response syndrome (SIRS) of non-infectious origin without acute organ dysfunction: Secondary | ICD-10-CM

## 2017-09-17 DIAGNOSIS — L03818 Cellulitis of other sites: Secondary | ICD-10-CM

## 2017-09-17 DIAGNOSIS — O8601 Infection of obstetric surgical wound, superficial incisional site: Secondary | ICD-10-CM

## 2017-09-17 LAB — CBC
HEMATOCRIT: 28.2 % — AB (ref 36.0–46.0)
Hemoglobin: 8.6 g/dL — ABNORMAL LOW (ref 12.0–15.0)
MCH: 26.9 pg (ref 26.0–34.0)
MCHC: 30.5 g/dL (ref 30.0–36.0)
MCV: 88.1 fL (ref 78.0–100.0)
PLATELETS: 286 10*3/uL (ref 150–400)
RBC: 3.2 MIL/uL — ABNORMAL LOW (ref 3.87–5.11)
RDW: 15.7 % — AB (ref 11.5–15.5)
WBC: 12.3 10*3/uL — AB (ref 4.0–10.5)

## 2017-09-17 LAB — PROTIME-INR
INR: 1.19
Prothrombin Time: 15 seconds (ref 11.4–15.2)

## 2017-09-17 MED ORDER — PATIENT'S GUIDE TO USING COUMADIN BOOK
Freq: Once | Status: AC
  Administered 2017-09-17: 17:00:00
  Filled 2017-09-17: qty 1

## 2017-09-17 MED ORDER — WARFARIN SODIUM 7.5 MG PO TABS
7.5000 mg | ORAL_TABLET | Freq: Once | ORAL | Status: AC
  Administered 2017-09-17: 7.5 mg via ORAL
  Filled 2017-09-17: qty 1

## 2017-09-17 NOTE — Progress Notes (Addendum)
ANTICOAGULATION CONSULT NOTE - Follow Up Consult  Pharmacy Consult for Lovenox and Coumadin Indication: pulmonary embolus  No Known Allergies  Patient Measurements: Height: 4\' 10"  (147.3 cm) Weight: 246 lb 14.4 oz (112 kg) IBW/kg (Calculated) : 40.9 Lovenox Dosing Weight: 112 kg  Vital Signs: Temp: 100.4 F (38 C) (04/04 1211) Temp Source: Oral (04/04 1211) BP: 127/75 (04/04 0343) Pulse Rate: 105 (04/04 0343)  Labs: Recent Labs    09/15/17 1712 09/16/17 0311 09/17/17 0407  HGB 8.6* 8.0* 8.6*  HCT 28.5* 25.4* 28.2*  PLT 260 246 286  LABPROT  --   --  15.0  INR  --   --  1.19  HEPARINUNFRC  --  <0.10*  --   CREATININE 0.74 0.65  --     Estimated Creatinine Clearance: 107.4 mL/min (by C-G formula based on SCr of 0.65 mg/dL).  Assessment: 37 yr old female on anticoagulation for bilateral PE ~2 weeks post-partum, C-section, hysterectomy. No DVT per duplex.      Transitioned from IV heparin to Lovenox and Coumadin on 4/3.  Day # 2 of minimum 5 overlap.  INR 1.19 after Coumadin 7.5 mg x 1 yesterday.  Hgb low stable; patient reports no vaginal or other bleeding.  Goal of Therapy:  INR 2-3 Anti-Xa level 0.6-1 units/ml 4hrs after LMWH dose given Monitor platelets by anticoagulation protocol: Yes   Plan:   Coumadin 7.5 mg again today.  Continue Lovenox 115 mg sq q12hrs.  Daily PT/INR.  Coumadin booklet and education provided.  Monitor for any bleeding.  Continue Lovenox/Coumadin overlap at least 5 days or until INR >2 x 24 hrs.  Patient reports that she gave her husband Lovenox in the past after orthopedic surgery, so could self-administer.  Dennie Fettersgan, Laporsha Grealish Donovan, [place] Pager: [phone] or [phone] 09/17/2017,1:27 PM

## 2017-09-17 NOTE — Progress Notes (Addendum)
Pt had a temp of 103. Pt given Tylenol and temp was 99.3 on recheck. Will continue to monitor.

## 2017-09-17 NOTE — Progress Notes (Addendum)
PROGRESS NOTE  Isabel Lewis ZOX:[id number] DOB: 1980-08-10 DOA: 09/15/2017 PCP: Patient, No Pcp Per  HPI/Recap of past 24 hours:  Isabel Lewis is a 37 y.o. year old female with medical history significant for emergent C-section and hysterectomy due to acute blood loss anemia on 3/21 in setting of placenta previa who presented on 09/15/2017 with chest pain or shortness of breath and was found to have acute bilateral pulmonary emboli.  Subjective history Having alternating left and right-sided back pain associated with exertion and deep breathing per patient  Assessment/Plan: Principal Problem:   Pulmonary embolism, delivered, with postpartum complication Active Problems:   Anterior placenta previa   Antepartum multigravida of advanced maternal age   S/P hysterectomy   Chest pain   Infection of obstetric surgical wound, superficial incisional site   #Acute bilateral PE, provoked (postpartum, C-section, hysterectomy), stable- currently on Lovenox bridge to warfarin.  Still having tachycardia but unclear if this is related to clot or possible infection, or something other.  Will assess right heart strain with TTE, encouragingly no DVT of lower extremities on ultrasound, continue to wean oxygen, oxyc 5 mg  4PRN, ketorolac q8 PRN pain control  #Sirs criteria Has had low-grade fever since arrival, T-max 103 overnight.  With intermittent sinus tachycardia on telemetry EKG.  Unclear if this is a potential side effect from known clot disease.  Will evaluate for possible infection with blood cultures, UA, close monitoring surgical wound site mentioned below.  Low threshold to start empiric antibiotics if clinically worsens  #Incisional cellulitis, related to C-section site.  Appreciate obstetric team assistance in evaluating incision started on Keflex 500 mg twice daily on 4/3.  Site looks clean on exam today.  We will continue to monitor  #morbid obesity, BMI 51  Code Status: Full  Family  Communication: No family at bedside   Disposition Plan: Given fluctuating fever, tachycardia would like to rule out infection and assure no right heart strain on TTE, patient will need to stay at least 24 more hours for further evaluation   Consultants:  Obstetrics  Procedures:  Antimicrobials:  Keflex 4/3  Cultures:   none  Telemetry: yes  DVT prophylaxis: Lovenox bridge to warfarin   Objective: Vitals:   09/17/17 0240 09/17/17 0343 09/17/17 0907 09/17/17 1211  BP:  127/75    Pulse:  (!) 105    Resp:  17    Temp: (!) 103 F (39.4 C) 99.3 F (37.4 C)  (!) 100.4 F (38 C)  TempSrc: Oral Oral  Oral  SpO2:  94% 100%   Weight:  112 kg (246 lb 14.4 oz)    Height:        Intake/Output Summary (Last 24 hours) at 09/17/2017 1245 Last data filed at 09/17/2017 0851 Gross per 24 hour  Intake 480 ml  Output 1400 ml  Net -920 ml   Filed Weights   09/15/17 2218 09/16/17 0412 09/17/17 0343  Weight: 113.6 kg (250 lb 7.1 oz) 112.7 kg (248 lb 6.4 oz) 112 kg (246 lb 14.4 oz)    Exam:  Constitutional:normal appearing female Eyes: EOMI, anicteric, normal conjunctivae ENMT: Oropharynx with moist mucous membranes, normal dentition Cardiovascular: Tachycardic no MRGs, 1+ pretibial pitting edema bilaterally of lower extremities Respiratory: Normal respiratory effort on 2 L nasal cannula, clear breath sounds  Abdomen: Soft, slightly tender to palpation with no guarding Skin: Incision site: Clean, dry, intact, small area of erythema above the incision site, nontender. Neurologic: Grossly no focal neuro deficit. Psychiatric:Appropriate affect, and  mood. Mental status AAOx3  Data Reviewed: CBC: Recent Labs  Lab 09/15/17 1712 09/16/17 0311 09/17/17 0407  WBC 9.2 8.7 12.3*  HGB 8.6* 8.0* 8.6*  HCT 28.5* 25.4* 28.2*  MCV 90.2 89.1 88.1  PLT 260 246 286   Basic Metabolic Panel: Recent Labs  Lab 09/15/17 1712 09/16/17 0311  NA 141 139  K 3.5 3.6  CL 107 106  CO2 23 24    GLUCOSE 132* 99  BUN 6 5*  CREATININE 0.74 0.65  CALCIUM 8.8* 8.4*   GFR: Estimated Creatinine Clearance: 107.4 mL/min (by C-G formula based on SCr of 0.65 mg/dL). Liver Function Tests: No results for input(s): AST, ALT, ALKPHOS, BILITOT, PROT, ALBUMIN in the last 168 hours. No results for input(s): LIPASE, AMYLASE in the last 168 hours. No results for input(s): AMMONIA in the last 168 hours. Coagulation Profile: Recent Labs  Lab 09/17/17 0407  INR 1.19   Cardiac Enzymes: No results for input(s): CKTOTAL, CKMB, CKMBINDEX, TROPONINI in the last 168 hours. BNP (last 3 results) No results for input(s): PROBNP in the last 8760 hours. HbA1C: No results for input(s): HGBA1C in the last 72 hours. CBG: No results for input(s): GLUCAP in the last 168 hours. Lipid Profile: No results for input(s): CHOL, HDL, LDLCALC, TRIG, CHOLHDL, LDLDIRECT in the last 72 hours. Thyroid Function Tests: No results for input(s): TSH, T4TOTAL, FREET4, T3FREE, THYROIDAB in the last 72 hours. Anemia Panel: No results for input(s): VITAMINB12, FOLATE, FERRITIN, TIBC, IRON, RETICCTPCT in the last 72 hours. Urine analysis: No results found for: COLORURINE, APPEARANCEUR, LABSPEC, PHURINE, GLUCOSEU, HGBUR, BILIRUBINUR, KETONESUR, PROTEINUR, UROBILINOGEN, NITRITE, LEUKOCYTESUR Sepsis Labs: @LABRCNTIP (procalcitonin:4,lacticidven:4)  ) Recent Results (from the past 240 hour(s))  MRSA PCR Screening     Status: None   Collection Time: 09/15/17 10:50 PM  Result Value Ref Range Status   MRSA by PCR NEGATIVE NEGATIVE Final    Comment:        The GeneXpert MRSA Assay (FDA approved for NASAL specimens only), is one component of a comprehensive MRSA colonization surveillance program. It is not intended to diagnose MRSA infection nor to guide or monitor treatment for MRSA infections. Performed at [facility], [address] [place], [place] [address]       Studies: No results found.  Scheduled  Meds: . cephALEXin  500 mg Oral Q12H  . docusate sodium  100 mg Oral BID  . enoxaparin (LOVENOX) injection  1 mg/kg Subcutaneous Q12H  . multivitamin with minerals  1 tablet Oral Q1200  . patient's guide to using coumadin book   Does not apply Once  . sodium chloride flush  3 mL Intravenous Q12H  . warfarin  7.5 mg Oral ONCE-1800  . Warfarin - Pharmacist Dosing Inpatient   Does not apply q1800    Continuous Infusions: . sodium chloride       LOS: 2 days     Laverna Peace, MD [facility] Pager [phone]  If 7PM-7AM, please contact night-coverage [email or web address] Password [email or web address] 09/17/2017, 12:45 PM

## 2017-09-17 NOTE — Progress Notes (Signed)
RN called into room by pt. Per pt she could feel her heart racing. Pt noted on monitor to have a run of svt with her hr being as high as 180 bpm. Pt currently ST w/ a hr of 111. Will continue to monitor the pt & await new orders. Sanda LingerMilam, Rosela Supak R, RN

## 2017-09-18 ENCOUNTER — Inpatient Hospital Stay (HOSPITAL_COMMUNITY): Payer: Medicaid Other

## 2017-09-18 DIAGNOSIS — R079 Chest pain, unspecified: Secondary | ICD-10-CM

## 2017-09-18 LAB — CBC
HEMATOCRIT: 26.9 % — AB (ref 36.0–46.0)
Hemoglobin: 8.2 g/dL — ABNORMAL LOW (ref 12.0–15.0)
MCH: 26.8 pg (ref 26.0–34.0)
MCHC: 30.5 g/dL (ref 30.0–36.0)
MCV: 87.9 fL (ref 78.0–100.0)
PLATELETS: 276 10*3/uL (ref 150–400)
RBC: 3.06 MIL/uL — AB (ref 3.87–5.11)
RDW: 15.6 % — ABNORMAL HIGH (ref 11.5–15.5)
WBC: 8.9 10*3/uL (ref 4.0–10.5)

## 2017-09-18 LAB — ECHOCARDIOGRAM COMPLETE
HEIGHTINCHES: 58 in
WEIGHTICAEL: 3880 [oz_av]

## 2017-09-18 LAB — PROTIME-INR
INR: 1.34
Prothrombin Time: 16.5 seconds — ABNORMAL HIGH (ref 11.4–15.2)

## 2017-09-18 MED ORDER — WARFARIN SODIUM 7.5 MG PO TABS
7.5000 mg | ORAL_TABLET | Freq: Once | ORAL | Status: AC
  Administered 2017-09-18: 7.5 mg via ORAL
  Filled 2017-09-18: qty 1

## 2017-09-18 MED ORDER — WARFARIN SODIUM 5 MG PO TABS: 7.5000 mg | ORAL_TABLET | Freq: Once | ORAL | 0 refills | Status: DC

## 2017-09-18 MED ORDER — CEPHALEXIN 500 MG PO CAPS: 500.0000 mg | ORAL_CAPSULE | Freq: Four times a day (QID) | ORAL | 0 refills | Status: AC

## 2017-09-18 MED ORDER — ENOXAPARIN SODIUM 120 MG/0.8ML ~~LOC~~ SOLN
1.0000 mg/kg | Freq: Two times a day (BID) | SUBCUTANEOUS | Status: DC
  Filled 2017-09-18: qty 0.73

## 2017-09-18 NOTE — Progress Notes (Signed)
SATURATION QUALIFICATIONS: (This note is used to comply with regulatory documentation for home oxygen)  Patient Saturations on Room Air at Rest = 95 %  Patient Saturations on Room Air while Ambulating =90 %  Patient Saturations on 3 Liters of oxygen while Ambulating = 99 %  Please briefly explain why patient needs home oxygen:

## 2017-09-18 NOTE — Progress Notes (Signed)
SATURATION QUALIFICATIONS: (This note is used to comply with regulatory documentation for home oxygen)  Patient Saturations on Room Air at Rest = 98 %  Patient Saturations on Room Air while Ambulating =84%  Patient Saturations on 3 Liters of oxygen while Ambulating =95%  Please briefly explain why patient needs home oxygen:

## 2017-09-18 NOTE — Discharge Summary (Addendum)
Discharge Summary  Isabel Gilbertdna Wiebelhaus ZOX:[id number] DOB: 11-01-1980  PCP: Patient, No Pcp Per  Admit date: 09/15/2017 Discharge date: 09/18/2017  Time spent: < 25 minutes  Admitted From: Home Disposition: Home  Recommendations for Outpatient Follow-up:  1. Will need INR check on Monday 2. Continue warfarin 7.5mg  until INR check 3. Continue Keflex 500 mg twice daily for total of 5 days, end date 09/20/17 4. Discharged on oxygen due to desaturation with walking oxygen test   Home Health:No Equipment/Devices:None  Discharge Diagnoses:  Active Hospital Problems   Diagnosis Date Noted  . Pulmonary embolism, delivered, with postpartum complication 09/15/2017  . SIRS (systemic inflammatory response syndrome) (HCC) 09/17/2017  . Infection of obstetric surgical wound, superficial incisional site 09/16/2017  . S/P hysterectomy 09/15/2017  . Chest pain 09/15/2017  . Anterior placenta previa 07/29/2017  . Antepartum multigravida of advanced maternal age 66/16/2019    Resolved Hospital Problems  No resolved problems to display.    Discharge Condition: Stable CODE STATUS:FULL Diet recommendation: Heart Healthy   Vitals:   09/18/17 0548 09/18/17 1500  BP: 107/79 (!) 144/79  Pulse: 98 (!) 102  Resp: 16 18  Temp: 98.5 F (36.9 C) (!) 100.4 F (38 C)  SpO2: 96% 97%    History of present illness:  Isabel Lewis is a 37 y.o. year old female with medical history significant for emergent C-section and hysterectomy due to acute blood loss anemia on 3/21 in setting of placenta previa who presented on 09/15/2017 with chest pain and shortness of breath and was found to have acute bilateral pulmonary emboli.  Hospital course addressed below in problem based format:    Hospital Course:  Principal Problem:   Pulmonary embolism, delivered, with postpartum complication Active Problems:   Anterior placenta previa   Antepartum multigravida of advanced maternal age   S/P hysterectomy   Chest pain  Infection of obstetric surgical wound, superficial incisional site   SIRS (systemic inflammatory response syndrome) (HCC)   #Acute bilateral PE, provoked (secondary to postpartum, recent C-section and emergent hysterectomy for acute blood loss anemia on 3/2)-workup for chest pain or shortness of breath patient was found to have bilateral PE on CTA.  No evidence of right heart strain on CT imaging or TTE.  She initially put on heparin drip then transitioned to Lovenox bridge to warfarin.  INR on discharge 1.34, to follow-up with OB/GYN on 4/8 for repeat INR check (goal INR 2-3).  To continue warfarin 7.5 mg until INR reassessed.  Prior to discharge patient patient was able to ambulate without any oxygen desaturation.    #Incisional cellulitis, related to recent C-section, stable- incisional site evaluated by our obstetric team and found to have purulent discharge underneath Dermabond.  Dermabond was removed area was clean.  Incision was monitored throughout the remaining hospitalization showed no obvious signs of infection.  Patient was continued on Keflex, will continue for total 5 days therapy (end date 09/20/17)  #Sirs criteria-patient had intermittent fevers during hospital stay.  Given blood cultures remained negative, unremarkable UA, patient remained asymptomatic without any systemic signs of infection this was thought most likely related to acute PE.  She was given strict return precautions if became persistently febrile, worsening shortness of breath, or changes in above-mentioned incisional site.  #Morbid obesity, BMI 51  Antibiotics: Keflex 4/3-continued on discharge(end date 09/20/17)  Microbiology: Blood: 4/4: negative growth   Consultations:  Obstetrics   Procedures/Studies: 4/5 TTE: EF 60-65%, normal wall motion of left ventricle, RVSP 23 (normal less than  30), normal right atrium size, no signs of right heart strain Ct Angio Chest Pe W And/or Wo Contrast  Result Date:  09/15/2017 CLINICAL DATA:  Right-sided chest pain with shortness of breath EXAM: CT ANGIOGRAPHY CHEST WITH CONTRAST TECHNIQUE: Multidetector CT imaging of the chest was performed using the standard protocol during bolus administration of intravenous contrast. Multiplanar CT image reconstructions and MIPs were obtained to evaluate the vascular anatomy. CONTRAST:  ISOVUE-370 IOPAMIDOL (ISOVUE-370) INJECTION 76% COMPARISON:  None. FINDINGS: Cardiovascular: Satisfactory opacification of the pulmonary arteries to the segmental level. Filling defect within the right inter lobar pulmonary artery and multiple right lower lobe segmental and subsegmental branches. Additional smaller emboli within left lower lobe segmental and subsegmental branches. RV LV ratio is normal. There is mild cardiomegaly. Nonaneurysmal aorta. No significant pericardial effusion. Mediastinum/Nodes: No enlarged mediastinal, hilar, or axillary lymph nodes. Thyroid gland, trachea, and esophagus demonstrate no significant findings. Lungs/Pleura: Trace right pleural effusion. Ground-glass opacity and mild consolidation in the right greater than left lower lobe. No pneumothorax Upper Abdomen: No acute abnormality. Musculoskeletal: No chest wall abnormality. No acute or significant osseous findings. Review of the MIP images confirms the above findings. IMPRESSION: 1. Positive for acute pulmonary embolus involving right inter lobar pulmonary artery and multiple segmental and subsegmental branches of the right lower lobe with additional small emboli within left lower lobe segmental and subsegmental branches. No evidence for right heart strain. 2. Small right pleural effusion. Ground-glass density and mild consolidations in the right greater than left lower lobe may reflect pneumonia or possible pulmonary infarction. Critical Value/emergent results were called by telephone at the time of interpretation on 09/15/2017 at 7:16 pm to Dr. Tilden Fossa , who  verbally acknowledged these results. Electronically Signed   By: Jasmine Pang M.D.   On: 09/15/2017 19:16     Discharge Exam: BP (!) 144/79 (BP Location: Right Arm)   Pulse (!) 102   Temp (!) 100.4 F (38 C) (Oral)   Resp 18   Ht 4\' 10"  (1.473 m)   Wt 110 kg (242 lb 8 oz)   LMP 01/05/2017 (LMP Unknown)   SpO2 97%   Breastfeeding? Yes   BMI 50.68 kg/m   General: Lying in bed, no apparent distress Eyes: EOMI, anicteric ENT: Oral Mucosa clear and moist Neck: normal, no thyromegaly Cardiovascular: regular rate and rhythm, no murmurs, rubs or gallops, no peripheral edema, no JVD  Respiratory: Normal respiratory effort on room air, lungs clear to auscultation bilaterally Abdomen: soft, non-distended, non-tender, normal bowel sounds Skin: Incision site (near umbilicus) clean, dry, intact; nontender, minimal signs of erythema, no purulent drainage Neurologic: Grossly no focal neuro deficit.Mental status AAOx3, speech normal, Psychiatric:Appropriate affect, and mood   Discharge Instructions You were cared for by a hospitalist during your hospital stay. If you have any questions about your discharge medications or the care you received while you were in the hospital after you are discharged, you can call the unit and asked to speak with the hospitalist on call if the hospitalist that took care of you is not available. Once you are discharged, your primary care physician will handle any further medical issues. Please note that NO REFILLS for any discharge medications will be authorized once you are discharged, as it is imperative that you return to your primary care physician (or establish a relationship with a primary care physician if you do not have one) for your aftercare needs so that they can reassess your need for medications and monitor  your lab values.  Discharge Instructions    Diet - low sodium heart healthy   Complete by:  As directed    Increase activity slowly   Complete by:   As directed      Allergies as of 09/18/2017   No Known Allergies     Medication List    TAKE these medications   ADVIL 200 MG tablet Generic drug:  ibuprofen Take 600 mg by mouth every 6 (six) hours as needed.   cephALEXin 500 MG capsule Commonly known as:  KEFLEX Take 1 capsule (500 mg total) by mouth 4 (four) times daily for 5 doses.   enoxaparin 100 MG/ML injection Commonly known as:  LOVENOX Inject 1.15 mLs (115 mg total) into the skin 2 (two) times daily for 4 days.   multivitamin-prenatal 27-0.8 MG Tabs tablet Take 1 tablet by mouth daily at 12 noon.   warfarin 5 MG tablet Commonly known as:  COUMADIN Take 1.5 tablets (7.5 mg total) by mouth one time only at 6 PM.            Durable Medical Equipment  (From admission, onward)        Start     Ordered   09/18/17 1628  For home use only DME oxygen  Once    Question Answer Comment  Mode or (Route) Nasal cannula   Liters per Minute 3   Frequency Continuous (stationary and portable oxygen unit needed)   Oxygen delivery system Gas      09/18/17 1627     No Known Allergies Follow-up Information    [facility] Follow up on 09/21/2017.   Why:  @ 9:30 am for Hospital F/u with Sindy Messing PA and following appointment will have PT/INR will be drawn.  Contact information: [address] [place] [facility] [id number] [phone]           The results of significant diagnostics from this hospitalization (including imaging, microbiology, ancillary and laboratory) are listed below for reference.    Significant Diagnostic Studies: Ct Angio Chest Pe W And/or Wo Contrast  Result Date: 09/15/2017 CLINICAL DATA:  Right-sided chest pain with shortness of breath EXAM: CT ANGIOGRAPHY CHEST WITH CONTRAST TECHNIQUE: Multidetector CT imaging of the chest was performed using the standard protocol during bolus administration of intravenous contrast. Multiplanar CT image  reconstructions and MIPs were obtained to evaluate the vascular anatomy. CONTRAST:  ISOVUE-370 IOPAMIDOL (ISOVUE-370) INJECTION 76% COMPARISON:  None. FINDINGS: Cardiovascular: Satisfactory opacification of the pulmonary arteries to the segmental level. Filling defect within the right inter lobar pulmonary artery and multiple right lower lobe segmental and subsegmental branches. Additional smaller emboli within left lower lobe segmental and subsegmental branches. RV LV ratio is normal. There is mild cardiomegaly. Nonaneurysmal aorta. No significant pericardial effusion. Mediastinum/Nodes: No enlarged mediastinal, hilar, or axillary lymph nodes. Thyroid gland, trachea, and esophagus demonstrate no significant findings. Lungs/Pleura: Trace right pleural effusion. Ground-glass opacity and mild consolidation in the right greater than left lower lobe. No pneumothorax Upper Abdomen: No acute abnormality. Musculoskeletal: No chest wall abnormality. No acute or significant osseous findings. Review of the MIP images confirms the above findings. IMPRESSION: 1. Positive for acute pulmonary embolus involving right inter lobar pulmonary artery and multiple segmental and subsegmental branches of the right lower lobe with additional small emboli within left lower lobe segmental and subsegmental branches. No evidence for right heart strain. 2. Small right pleural effusion. Ground-glass density and mild consolidations in the right  greater than left lower lobe may reflect pneumonia or possible pulmonary infarction. Critical Value/emergent results were called by telephone at the time of interpretation on 09/15/2017 at 7:16 pm to Dr. Tilden Fossa , who verbally acknowledged these results. Electronically Signed   By: Jasmine Pang M.D.   On: 09/15/2017 19:16    Microbiology: Recent Results (from the past 240 hour(s))  MRSA PCR Screening     Status: None   Collection Time: 09/15/17 10:50 PM  Result Value Ref Range Status    MRSA by PCR NEGATIVE NEGATIVE Final    Comment:        The GeneXpert MRSA Assay (FDA approved for NASAL specimens only), is one component of a comprehensive MRSA colonization surveillance program. It is not intended to diagnose MRSA infection nor to guide or monitor treatment for MRSA infections. Performed at [facility], [address], [place], [place] [address]   Culture, blood (Routine X 2) w Reflex to ID Panel     Status: None (Preliminary result)   Collection Time: 09/17/17 10:30 AM  Result Value Ref Range Status   Specimen Description BLOOD RIGHT ANTECUBITAL  Final   Special Requests   Final    BOTTLES DRAWN AEROBIC AND ANAEROBIC Blood Culture adequate volume   Culture   Final    NO GROWTH 1 DAY Performed at [facility], [address] [place], [place] [address]    Report Status PENDING  Incomplete  Culture, blood (Routine X 2) w Reflex to ID Panel     Status: None (Preliminary result)   Collection Time: 09/17/17 10:45 AM  Result Value Ref Range Status   Specimen Description BLOOD RIGHT FOREARM  Final   Special Requests   Final    BOTTLES DRAWN AEROBIC AND ANAEROBIC Blood Culture adequate volume   Culture   Final    NO GROWTH 1 DAY Performed at [facility], [address] [place], [place] [address]    Report Status PENDING  Incomplete     Labs: Basic Metabolic Panel: Recent Labs  Lab 09/15/17 1712 09/16/17 0311  NA 141 139  K 3.5 3.6  CL 107 106  CO2 23 24  GLUCOSE 132* 99  BUN 6 5*  CREATININE 0.74 0.65  CALCIUM 8.8* 8.4*   Liver Function Tests: No results for input(s): AST, ALT, ALKPHOS, BILITOT, PROT, ALBUMIN in the last 168 hours. No results for input(s): LIPASE, AMYLASE in the last 168 hours. No results for input(s): AMMONIA in the last 168 hours. CBC: Recent Labs  Lab 09/15/17 1712 09/16/17 0311 09/17/17 0407 09/18/17 0406  WBC 9.2 8.7 12.3* 8.9  HGB 8.6* 8.0* 8.6* 8.2*  HCT 28.5* 25.4* 28.2* 26.9*  MCV 90.2 89.1  88.1 87.9  PLT 260 246 286 276   Cardiac Enzymes: No results for input(s): CKTOTAL, CKMB, CKMBINDEX, TROPONINI in the last 168 hours. BNP: BNP (last 3 results) No results for input(s): BNP in the last 8760 hours.  ProBNP (last 3 results) No results for input(s): PROBNP in the last 8760 hours.  CBG: No results for input(s): GLUCAP in the last 168 hours.     Signed:  Laverna Peace, MD [facility] 09/18/2017, 5:23 PM

## 2017-09-18 NOTE — Progress Notes (Signed)
  Echocardiogram 2D Echocardiogram has been performed.  Isabel Lewis, Isabel Lewis R 09/18/2017, 3:16 PM

## 2017-09-18 NOTE — Progress Notes (Signed)
ANTICOAGULATION CONSULT NOTE - Follow Up Consult  Pharmacy Consult for Lovenox and Coumadin Indication: pulmonary embolus  No Known Allergies  Patient Measurements: Height: 4\' 10"  (147.3 cm) Weight: 242 lb 8 oz (110 kg) IBW/kg (Calculated) : 40.9 Lovenox Dosing Weight: down from 114 to 110 kg  Vital Signs: Temp: 98.5 F (36.9 C) (04/05 0548) Temp Source: Oral (04/05 0548) BP: 107/79 (04/05 0548) Pulse Rate: 98 (04/05 0548)  Labs: Recent Labs    09/15/17 1712 09/16/17 0311 09/17/17 0407 09/18/17 0406  HGB 8.6* 8.0* 8.6* 8.2*  HCT 28.5* 25.4* 28.2* 26.9*  PLT 260 246 286 276  LABPROT  --   --  15.0 16.5*  INR  --   --  1.19 1.34  HEPARINUNFRC  --  <0.10*  --   --   CREATININE 0.74 0.65  --   --     Estimated Creatinine Clearance: 106.1 mL/min (by C-G formula based on SCr of 0.65 mg/dL).  Assessment: 37 yr old female on anticoagulation for bilateral PE ~2 weeks post-partum, C-section, hysterectomy. No DVT per duplex.      Transitioned from IV heparin to Lovenox and Coumadin on 4/3.  Day # 3 of minimum 5 overlap.  INR 1.34 after Coumadin 7.5 mg x 2 doses.  Hgb low stable; patient reports no vaginal or other bleeding.    Weight 114>110 kg since admitted.  Will tweak Lovenox dose from 115 to 110 mg.   Discussed with patient, since she has self-administered.  Hoping for discharge today.  Goal of Therapy:  INR 2-3 Anti-Xa level 0.6-1 units/ml 4hrs after LMWH dose given Monitor platelets by anticoagulation protocol: Yes   Plan:   Coumadin 7.5 mg again today.  Modify Lovenox 115 mg to 110 mg sq q12hrs.  Daily PT/INR.  Coumadin booklet and education provided.  Monitor for any bleeding.  Continue Lovenox/Coumadin overlap at least 5 days or until INR >2 x 24 hrs.  If discharged today, would continue Coumadin 7.5 mg daily through the weekend and check INR on Monday, 4/8. Suggest prescribing Coumadin 5 mg tablets for flexibility with dosage adjustments.  Dennie Fettersgan, Nadirah Socorro  [place], [place] Pager: [phone] or [phone] 09/18/2017,12:52 PM

## 2017-09-21 ENCOUNTER — Ambulatory Visit (INDEPENDENT_AMBULATORY_CARE_PROVIDER_SITE_OTHER): Payer: Medicaid Other | Admitting: Physician Assistant

## 2017-09-22 LAB — CULTURE, BLOOD (ROUTINE X 2)
Culture: NO GROWTH
Culture: NO GROWTH
SPECIAL REQUESTS: ADEQUATE
SPECIAL REQUESTS: ADEQUATE

## 2017-09-25 ENCOUNTER — Ambulatory Visit (INDEPENDENT_AMBULATORY_CARE_PROVIDER_SITE_OTHER): Payer: Medicaid Other | Admitting: Physician Assistant

## 2017-10-20 ENCOUNTER — Encounter (INDEPENDENT_AMBULATORY_CARE_PROVIDER_SITE_OTHER): Payer: Self-pay | Admitting: Physician Assistant

## 2017-10-20 ENCOUNTER — Ambulatory Visit (INDEPENDENT_AMBULATORY_CARE_PROVIDER_SITE_OTHER): Payer: Medicaid Other | Admitting: Physician Assistant

## 2017-10-20 VITALS — BP 125/86 | HR 72 | Temp 97.8°F | Resp 18 | Ht <= 58 in | Wt 254.0 lb

## 2017-10-20 DIAGNOSIS — Z7901 Long term (current) use of anticoagulants: Secondary | ICD-10-CM | POA: Diagnosis not present

## 2017-10-20 DIAGNOSIS — I2699 Other pulmonary embolism without acute cor pulmonale: Secondary | ICD-10-CM

## 2017-10-20 NOTE — Progress Notes (Signed)
Subjective:  Patient ID: Isabel Lewis, female    DOB: 03-09-1981  Age: 37 y.o. MRN: [id number]  CC: hospital f/u   HPI Isabel Lewis is a 37 y.o. female with a medical history of placenta previa presents on hospital discharge f/u. Admitted on 09/15/17 and discharged on 09/18/17. Diagnosed with PE. Discharged on Warfarin 7.5 mg with directions to f/u with OB/GYN and establish care with a PCP. Has not been able to f/u with OB/GYN to discuss placenta previa but did go to surgeon in [facility] and was told she did not need further treatment beside light duty at work. States she is completely asymptomatic. Not sleeping well due to her newborn baby.     Pt has not had any INR testing since her discharge last month. Taking Warfarin 7.5 mg daily. Does not have any bleeding episodes. Denies CP, palpitations, SOB, HA, f/c/n/v, abdominal pain, bruising, petechiae, rash, swelling, or GI/GU sxs.      Outpatient Medications Prior to Visit  Medication Sig Dispense Refill  . ibuprofen (ADVIL) 200 MG tablet Take 600 mg by mouth every 6 (six) hours as needed.    . warfarin (COUMADIN) 5 MG tablet Take 1.5 tablets (7.5 mg total) by mouth one time only at 6 PM. 90 tablet 0  . enoxaparin (LOVENOX) 100 MG/ML injection Inject 1.15 mLs (115 mg total) into the skin 2 (two) times daily for 4 days. 9.2 mL 0  . Prenatal Vit-Fe Fumarate-FA (MULTIVITAMIN-PRENATAL) 27-0.8 MG TABS tablet Take 1 tablet by mouth daily at 12 noon.     No facility-administered medications prior to visit.      ROS Review of Systems  Constitutional: Negative for chills, fever and malaise/fatigue.  Eyes: Negative for blurred vision.  Respiratory: Negative for shortness of breath.   Cardiovascular: Negative for chest pain and palpitations.  Gastrointestinal: Negative for abdominal pain and nausea.  Genitourinary: Negative for dysuria and hematuria.  Musculoskeletal: Negative for joint pain and myalgias.  Skin: Negative for rash.   Neurological: Negative for tingling and headaches.  Psychiatric/Behavioral: Negative for depression. The patient is not nervous/anxious.     Objective:  BP 125/86 (BP Location: Right Arm, Patient Position: Sitting, Cuff Size: Large)   Pulse 72   Temp 97.8 F (36.6 C) (Oral)   Resp 18   Ht  (1.448 m)   Wt 254 lb (115.2 kg)   LMP 01/05/2017 (LMP Unknown)   SpO2 97%   BMI 54.97 kg/m   BP/Weight 10/20/2017 09/18/2017 08/18/2017  Systolic BP 125 144 100  Diastolic BP 86 79 69  Wt. (Lbs) 254 242.5 264.3  BMI 54.97 50.68 55.24      Physical Exam  Constitutional: She is oriented to person, place, and time.  Well developed, well nourished, NAD, polite  HENT:  Head: Normocephalic and atraumatic.  Eyes: No scleral icterus.  Neck: Normal range of motion. Neck supple. No thyromegaly present.  Cardiovascular: Normal rate, regular rhythm and normal heart sounds.  Pulmonary/Chest: Effort normal and breath sounds normal.  Abdominal: Soft. Bowel sounds are normal. There is no tenderness.  Musculoskeletal: She exhibits no edema.  Neurological: She is alert and oriented to person, place, and time.  Skin: Skin is warm and dry. No rash noted. No erythema. No pallor.  Psychiatric: She has a normal mood and affect. Her behavior is normal. Thought content normal.  Vitals reviewed.    Assessment & Plan:   1. Other acute pulmonary embolism without acute cor pulmonale (HCC) - CBC with  Differential  2. Current use of anticoagulant therapy - Protime-INR   Follow-up: Return in about 1 month (around 11/17/2017) for INR.   Loletta Specter PA

## 2017-10-20 NOTE — Patient Instructions (Signed)
Warfarin tablets °What is this medicine? °WARFARIN (WAR far in) is an anticoagulant. It is used to treat or prevent clots in the veins, arteries, lungs, or heart. °This medicine may be used for other purposes; ask your health care provider or pharmacist if you have questions. °COMMON BRAND NAME(S): Coumadin, Jantoven °What should I tell my health care provider before I take this medicine? °They need to know if you have any of these conditions: °-alcoholism °-anemia °-bleeding disorders °-cancer °-diabetes °-heart disease °-high blood pressure °-history of bleeding in the gastrointestinal tract °-history of stroke or other brain injury or disease °-kidney or liver disease °-protein C deficiency °-protein S deficiency °-psychosis or dementia °-recent injury, recent or planned surgery or procedure °-an unusual or allergic reaction to warfarin, other medicines, foods, dyes, or preservatives °-pregnant or trying to get pregnant °-breast-feeding °How should I use this medicine? °Take this medicine by mouth with a glass of water. Follow the directions on the prescription label. You can take this medicine with or without food. Take your medicine at the same time each day. Do not take it more often than directed. Do not stop taking except on your doctor's advice. Stopping this medicine may increase your risk of a blood clot. Be sure to refill your prescription before you run out of medicine. °If your doctor or healthcare professional calls to change your dose, write down the dose and any other instructions. Always read the dose and instructions back to him or her to make sure you understand them. Tell your doctor or healthcare professional what strength of tablets you have on hand. Ask how many tablets you should take to equal your new dose. Write the date on the new instructions and keep them near your medicine. If you are told to stop taking your medicine until your next blood test, call your doctor or healthcare  professional if you do not hear anything within 24 hours of the test to find out your new dose or when to restart your prior dose. °A special MedGuide will be given to you by the pharmacist with each prescription and refill. Be sure to read this information carefully each time. °Talk to your pediatrician regarding the use of this medicine in children. Special care may be needed. °Overdosage: If you think you have taken too much of this medicine contact a poison control center or emergency room at once. °NOTE: This medicine is only for you. Do not share this medicine with others. °What if I miss a dose? °It is important not to miss a dose. If you miss a dose, call your healthcare provider. Take the dose as soon as possible on the same day. If it is almost time for your next dose, take only that dose. Do not take double or extra doses to make up for a missed dose. °What may interact with this medicine? °Do not take this medicine with any of the following medications: °-agents that prevent or dissolve blood clots °-aspirin or other salicylates °-danshen °-dextrothyroxine °-mifepristone °-St. John's Wort °-red yeast rice °This medicine may also interact with the following medications: °-acetaminophen °-agents that lower cholesterol °-alcohol °-allopurinol °-amiodarone °-antibiotics or medicines for treating bacterial, fungal or viral infections °-azathioprine °-barbiturate medicines for inducing sleep or treating seizures °-certain medicines for diabetes °-certain medicines for heart rhythm problems °-certain medicines for hepatitis C virus infections like daclatasvir, dasabuvir; ombitasvir; paritaprevir; ritonavir, elbasvir; grazoprevir, ledipasvir; sofosbuvir, simeprevir, sofosbuvir, sofosbuvir; velpatasvir, sofosbuvir; velpatasvir; voxilaprevir °-certain medicines for high blood pressure °-chloral hydrate °-  cisapride °-conivaptan °-disulfiram °-female hormones, including contraceptive or birth control pills °-general  anesthetics °-herbal or dietary products like garlic, ginkgo, ginseng, green tea, or kava kava °-influenza virus vaccine °-female hormones °-medicines for mental depression or psychosis °-medicines for some types of cancer °-medicines for stomach problems °-methylphenidate °-NSAIDs, medicines for pain and inflammation, like ibuprofen or naproxen °-propoxyphene °-quinidine, quinine °-raloxifene °-seizure or epilepsy medicine like carbamazepine, phenytoin, and valproic acid °-steroids like cortisone and prednisone °-tamoxifen °-thyroid medicine °-tramadol °-vitamin c, vitamin e, and vitamin K °-zafirlukast °-zileuton °This list may not describe all possible interactions. Give your health care provider a list of all the medicines, herbs, non-prescription drugs, or dietary supplements you use. Also tell them if you smoke, drink alcohol, or use illegal drugs. Some items may interact with your medicine. °What should I watch for while using this medicine? °Visit your doctor or health care professional for regular checks on your progress. You will need to have a blood test called a PT/INR regularly. The PT/INR blood test is done to make sure you are getting the right dose of this medicine. It is important to not miss your appointment for the blood tests. When you first start taking this medicine, these tests are done often. Once the correct dose is determined and you take your medicine properly, these tests can be done less often. °Notify your doctor or health care professional and seek emergency treatment if you develop breathing problems; changes in vision; chest pain; severe, sudden headache; pain, swelling, warmth in the leg; trouble speaking; sudden numbness or weakness of the face, arm or leg. These can be signs that your condition has gotten worse. °While you are taking this medicine, carry an identification card with your name, the name and dose of medicine(s) being used, and the name and phone number of your doctor  or health care professional or person to contact in an emergency. °Do not start taking or stop taking any medicines or over-the-counter medicines except on the advice of your doctor or health care professional. °You should discuss your diet with your doctor or health care professional. Do not make major changes in your diet. Vitamin K can affect how well this medicine works. Many foods contain vitamin K. It is important to eat a consistent amount of foods with vitamin K. Other foods with vitamin K that you should eat in consistent amounts are asparagus, basil, black eyed peas, broccoli, brussel sprouts, cabbage, green onions, green tea, parsley, green leafy vegetables like beet greens, collard greens, kale, spinach, turnip greens, or certain lettuces like green leaf or romaine. °This medicine can cause birth defects or bleeding in an unborn child. Women of childbearing age should use effective birth control while taking this medicine. If a woman becomes pregnant while taking this medicine, she should discuss the potential risks and her options with her health care professional. °Avoid sports and activities that might cause injury while you are using this medicine. Severe falls or injuries can cause unseen bleeding. Be careful when using sharp tools or knives. Consider using an electric razor. Take special care brushing or flossing your teeth. Report any injuries, bruising, or red spots on the skin to your doctor or health care professional. °If you have an illness that causes vomiting, diarrhea, or fever for more than a few days, contact your doctor. Also check with your doctor if you are unable to eat for several days. These problems can change the effect of this medicine. °Even after you stop taking this   medicine, it takes several days before your body recovers its normal ability to clot blood. Ask your doctor or health care professional how long you need to be careful. If you are going to have surgery or dental  work, tell your doctor or health care professional that you have been taking this medicine. °What side effects may I notice from receiving this medicine? °Side effects that you should report to your doctor or health care professional as soon as possible: °-allergic reactions like skin rash, itching or hives, swelling of the face, lips, or tongue °-breathing problems °-chest pain °-dizziness °-headache °-heavy menstrual bleeding or vaginal bleeding °-pain in the lower back or side °-painful, blue or purple toes °-painful skin ulcers that do not go away °-signs and symptoms of bleeding such as bloody or black, tarry stools; red or dark-brown urine; spitting up blood or brown material that looks like coffee grounds; red spots on the skin; unusual bruising or bleeding from the eye, gums, or nose °-stomach pain °-unusually weak or tired °Side effects that usually do not require medical attention (report to your doctor or health care professional if they continue or are bothersome): °-diarrhea °-hair loss °This list may not describe all possible side effects. Call your doctor for medical advice about side effects. You may report side effects to FDA at 1-800-FDA-1088. °Where should I keep my medicine? °Keep out of the reach of children. °Store at room temperature between 15 and 30 degrees C (59 and 86 degrees F). Protect from light. Throw away any unused medicine after the expiration date. Do not flush down the toilet. °NOTE: This sheet is a summary. It may not cover all possible information. If you have questions about this medicine, talk to your doctor, pharmacist, or health care provider. °© 2018 [employer] (2016-05-22 11:27:41) ° °

## 2017-10-21 LAB — CBC WITH DIFFERENTIAL/PLATELET
BASOS: 0 %
Basophils Absolute: 0 10*3/uL (ref 0.0–0.2)
EOS (ABSOLUTE): 0.1 10*3/uL (ref 0.0–0.4)
Eos: 2 %
HEMOGLOBIN: 10.8 g/dL — AB (ref 11.1–15.9)
Hematocrit: 34.5 % (ref 34.0–46.6)
IMMATURE GRANS (ABS): 0 10*3/uL (ref 0.0–0.1)
Immature Granulocytes: 0 %
LYMPHS ABS: 1.8 10*3/uL (ref 0.7–3.1)
LYMPHS: 32 %
MCH: 24.8 pg — AB (ref 26.6–33.0)
MCHC: 31.3 g/dL — AB (ref 31.5–35.7)
MCV: 79 fL (ref 79–97)
MONOCYTES: 6 %
Monocytes Absolute: 0.4 10*3/uL (ref 0.1–0.9)
NEUTROS ABS: 3.3 10*3/uL (ref 1.4–7.0)
Neutrophils: 60 %
Platelets: 308 10*3/uL (ref 150–379)
RBC: 4.35 x10E6/uL (ref 3.77–5.28)
RDW: 16.2 % — ABNORMAL HIGH (ref 12.3–15.4)
WBC: 5.6 10*3/uL (ref 3.4–10.8)

## 2017-10-21 LAB — PROTIME-INR
INR: 2.2 — AB (ref 0.8–1.2)
Prothrombin Time: 21.4 s — ABNORMAL HIGH (ref 9.1–12.0)

## 2017-10-26 ENCOUNTER — Other Ambulatory Visit: Payer: Self-pay | Admitting: Nurse Practitioner

## 2017-10-26 DIAGNOSIS — I2699 Other pulmonary embolism without acute cor pulmonale: Secondary | ICD-10-CM

## 2017-10-26 DIAGNOSIS — O43213 Placenta accreta, third trimester: Secondary | ICD-10-CM

## 2017-10-30 ENCOUNTER — Telehealth (INDEPENDENT_AMBULATORY_CARE_PROVIDER_SITE_OTHER): Payer: Self-pay

## 2017-10-30 NOTE — Telephone Encounter (Signed)
Patient is aware that INR is at goal at this time. She is still currently taking coumadin. She knows that she needs to keep her follow up appointments with GYN as her condition can be fatal. And also to keep appointments with PCP. Patient was informed that coumadin was changed to xarelto which works the same as coumadin. However we do not have to have her come in for blood work. Instructed to pick up xarelto from [facility] as soon as possible. Stop coumadin once she starts xarelto. Will take xarelto until further notice. Patient has requested that Xarelto be sent to [facility] on [address] Maryjean Morn, CMA

## 2017-10-30 NOTE — Telephone Encounter (Signed)
-----   Message from Claiborne Rigg, NP sent at 10/27/2017  2:39 PM EDT ----- Please let patient know I am taking her off coumadin and will be starting her on xarelto which works the same way as coumadin however we do not have to continue to have her come in for blood work. [facility] as [facility]. She can stop coumadin once she starts xarelto. She will take this until further notice.

## 2017-11-02 ENCOUNTER — Other Ambulatory Visit: Payer: Self-pay | Admitting: Nurse Practitioner

## 2017-11-02 MED ORDER — RIVAROXABAN 20 MG PO TABS: 20.0000 mg | ORAL_TABLET | Freq: Every day | ORAL | 0 refills | Status: DC

## 2017-11-02 NOTE — Telephone Encounter (Signed)
Left voicemail informing patient of the following Xarelto has been sent to [facility]. She can stop the coumadin once she has received the Xarelto. She needs to switch over as soon as possible. If she waits too long we will need to check another PT/INR. Maryjean Morn, CMA

## 2017-11-02 NOTE — Telephone Encounter (Signed)
Xarelto has been sent to [facility]. She can stop the coumadin once she has received the Xarelto. She needs to switch over as soon as possible. If she waits too long we will need to check another PT/INR.

## 2017-11-04 NOTE — Telephone Encounter (Signed)
Spoke with patient to ensure she had received message. Patient did receive message and has picked up and began taking Xarelto. Isabel Lewis, CMA

## 2017-11-05 ENCOUNTER — Ambulatory Visit: Payer: Medicaid Other | Admitting: Obstetrics and Gynecology

## 2017-11-18 ENCOUNTER — Ambulatory Visit: Payer: Medicaid Other | Admitting: Obstetrics and Gynecology

## 2017-11-23 ENCOUNTER — Ambulatory Visit (INDEPENDENT_AMBULATORY_CARE_PROVIDER_SITE_OTHER): Payer: Medicaid Other | Admitting: Physician Assistant

## 2017-12-21 ENCOUNTER — Other Ambulatory Visit: Payer: Self-pay

## 2017-12-21 ENCOUNTER — Ambulatory Visit (INDEPENDENT_AMBULATORY_CARE_PROVIDER_SITE_OTHER): Payer: Medicaid Other | Admitting: Physician Assistant

## 2017-12-21 ENCOUNTER — Encounter (INDEPENDENT_AMBULATORY_CARE_PROVIDER_SITE_OTHER): Payer: Self-pay | Admitting: Physician Assistant

## 2017-12-21 VITALS — BP 133/87 | HR 66 | Temp 98.4°F | Ht <= 58 in | Wt 242.2 lb

## 2017-12-21 DIAGNOSIS — Z7901 Long term (current) use of anticoagulants: Secondary | ICD-10-CM

## 2017-12-21 DIAGNOSIS — Z6841 Body Mass Index (BMI) 40.0 and over, adult: Secondary | ICD-10-CM | POA: Diagnosis not present

## 2017-12-21 DIAGNOSIS — L650 Telogen effluvium: Secondary | ICD-10-CM

## 2017-12-21 DIAGNOSIS — Z79899 Other long term (current) drug therapy: Secondary | ICD-10-CM | POA: Diagnosis not present

## 2017-12-21 MED ORDER — RIVAROXABAN 20 MG PO TABS: 20.0000 mg | ORAL_TABLET | Freq: Every day | ORAL | 1 refills | Status: DC

## 2017-12-21 MED ORDER — PHENTERMINE HCL 15 MG PO CAPS: 15.0000 mg | ORAL_CAPSULE | ORAL | 0 refills | Status: DC

## 2017-12-21 NOTE — Progress Notes (Signed)
Subjective:  Patient ID: Isabel Lewis, female    DOB: 07-11-1980  Age: 37 y.o. MRN: [id number]  CC: INR follow up  HPI Isabel Lewis is a 37 y.o. female with a medical history of placenta previa presents for INR f/u. Last INR 2.2 two months ago. Has been switched to Xarelto since then. She is almost out of Xarelto and requests refill. Says she understands Xarelto does not have an "antidote" and wants to continue using Xarelto as opposed to Coumadin.    Unhappy about her weight. Started exercising one month ago. Dieting a month and a half ago by eating low carb foods. Weighs 238 lbs today which is reportedly a six pound weight loss over one month and a half. Would like to start phentermine. Is not breast feeding any longer.    Complains about hair loss and receding hair line. Began after giving birth three months ago. Believes she may be growing her hair back but is still nonetheless concerned. Does not have history of thyroid disease. No other associated symptoms.        Outpatient Medications Prior to Visit  Medication Sig Dispense Refill  . rivaroxaban (XARELTO) 20 MG TABS tablet Take 1 tablet (20 mg total) by mouth daily with supper. 90 tablet 0   No facility-administered medications prior to visit.      ROS Review of Systems  Constitutional: Negative for chills, fever and malaise/fatigue.  Eyes: Negative for blurred vision.  Respiratory: Negative for shortness of breath.   Cardiovascular: Negative for chest pain and palpitations.  Gastrointestinal: Negative for abdominal pain and nausea.  Genitourinary: Negative for dysuria and hematuria.  Musculoskeletal: Negative for joint pain and myalgias.  Skin: Negative for rash.       Hair loss  Neurological: Negative for tingling and headaches.  Psychiatric/Behavioral: Negative for depression. The patient is not nervous/anxious.     Objective:  BP 133/87 (BP Location: Right Arm, Patient Position: Sitting, Cuff Size: Large)   Pulse 66    Temp 98.4 F (36.9 C) (Oral)   Ht 4\' 10"  (1.473 m)   Wt 242 lb 3.2 oz (109.9 kg)   LMP 01/05/2017 (LMP Unknown)   SpO2 93%   Breastfeeding? No   BMI 50.62 kg/m   BP/Weight 12/21/2017 10/20/2017 09/18/2017  Systolic BP 133 125 144  Diastolic BP 87 86 79  Wt. (Lbs) 242.2 254 242.5  BMI 50.62 54.97 50.68      Physical Exam  Constitutional: She is oriented to person, place, and time.  Well developed, well nourished, NAD, polite  HENT:  Head: Normocephalic and atraumatic.  Eyes: No scleral icterus.  Neck: Normal range of motion. Neck supple. No thyromegaly present.  Cardiovascular: Normal rate, regular rhythm and normal heart sounds.  Pulmonary/Chest: Effort normal and breath sounds normal.  Musculoskeletal: She exhibits no edema.  Neurological: She is alert and oriented to person, place, and time.  Skin: Skin is warm and dry. No rash noted. No erythema. No pallor.  Minimal recession of hair at temples. No petechiae or bruising  Psychiatric: She has a normal mood and affect. Her behavior is normal. Thought content normal.  Vitals reviewed.    Assessment & Plan:     1. Current use of anticoagulant therapy - Refill Xarelto 20 mg one tablet po qday x90 days #90, one refill  2. High risk medication use - Discussed xarelto use. Advised to abstain from alcohol during Xarelto use.  3. Telogen effluvium - Reassured patient hair loss is not  permanent  4. Class 3 severe obesity without serious comorbidity with body mass index (BMI) of 50.0 to 59.9 in adult, unspecified obesity type (HCC) - Begin Phentermine 15 mg one tablet po qday x30 days #30 zero refills - Advised to monitor heart rate and blood pressure; stop if there is a significant increase or side effects.     Meds ordered this encounter  Medications  . rivaroxaban (XARELTO) 20 MG TABS tablet    Sig: Take 1 tablet (20 mg total) by mouth daily with supper.    Dispense:  90 tablet    Refill:  1    Order Specific  Question:   Supervising Provider    Answer:   Hoy Register [[id number]]  . phentermine 15 MG capsule    Sig: Take 1 capsule (15 mg total) by mouth every morning.    Dispense:  30 capsule    Refill:  0    Order Specific Question:   Supervising Provider    Answer:   Hoy Register [[id number]]    Follow-up: Return in about 1 month (around 01/18/2018) for anticoagulant .   Loletta Specter PA

## 2017-12-21 NOTE — Patient Instructions (Addendum)
Telogen effluvium   Phentermine tablets or capsules What is this medicine? PHENTERMINE (FEN ter meen) decreases your appetite. It is used with a reduced calorie diet and exercise to help you lose weight. This medicine may be used for other purposes; ask your health care provider or pharmacist if you have questions. COMMON BRAND NAME(S): Adipex-P, Atti-Plex P, Atti-Plex P Spansule, Fastin, Lomaira, Pro-Fast, Tara-8 What should I tell my health care provider before I take this medicine? They need to know if you have any of these conditions: -agitation -glaucoma -heart disease -high blood pressure -history of substance abuse -lung disease called Primary Pulmonary Hypertension (PPH) -taken an MAOI like Carbex, Eldepryl, Marplan, Nardil, or Parnate in last 14 days -thyroid disease -an unusual or allergic reaction to phentermine, other medicines, foods, dyes, or preservatives -pregnant or trying to get pregnant -breast-feeding How should I use this medicine? Take this medicine by mouth with a glass of water. Follow the directions on the prescription label. The instructions for use may differ based on the product and dose you are taking. Avoid taking this medicine in the evening. It may interfere with sleep. Take your doses at regular intervals. Do not take your medicine more often than directed. Talk to your pediatrician regarding the use of this medicine in children. While this drug may be prescribed for children 17 years or older for selected conditions, precautions do apply. Overdosage: If you think you have taken too much of this medicine contact a poison control center or emergency room at once. NOTE: This medicine is only for you. Do not share this medicine with others. What if I miss a dose? If you miss a dose, take it as soon as you can. If it is almost time for your next dose, take only that dose. Do not take double or extra doses. What may interact with this medicine? Do not take  this medicine with any of the following medications: -duloxetine -MAOIs like Carbex, Eldepryl, Marplan, Nardil, and Parnate -medicines for colds or breathing difficulties like pseudoephedrine or phenylephrine -procarbazine -sibutramine -SSRIs like citalopram, escitalopram, fluoxetine, fluvoxamine, paroxetine, and sertraline -stimulants like dexmethylphenidate, methylphenidate or modafinil -venlafaxine This medicine may also interact with the following medications: -medicines for diabetes This list may not describe all possible interactions. Give your health care provider a list of all the medicines, herbs, non-prescription drugs, or dietary supplements you use. Also tell them if you smoke, drink alcohol, or use illegal drugs. Some items may interact with your medicine. What should I watch for while using this medicine? Notify your physician immediately if you become short of breath while doing your normal activities. Do not take this medicine within 6 hours of bedtime. It can keep you from getting to sleep. Avoid drinks that contain caffeine and try to stick to a regular bedtime every night. This medicine was intended to be used in addition to a healthy diet and exercise. The best results are achieved this way. This medicine is only indicated for short-term use. Eventually your weight loss may level out. At that point, the drug will only help you maintain your new weight. Do not increase or in any way change your dose without consulting your doctor. You may get drowsy or dizzy. Do not drive, use machinery, or do anything that needs mental alertness until you know how this medicine affects you. Do not stand or sit up quickly, especially if you are an older patient. This reduces the risk of dizzy or fainting spells. Alcohol may increase dizziness  and drowsiness. Avoid alcoholic drinks. What side effects may I notice from receiving this medicine? Side effects that you should report to your doctor or  health care professional as soon as possible: -chest pain, palpitations -depression or severe changes in mood -increased blood pressure -irritability -nervousness or restlessness -severe dizziness -shortness of breath -problems urinating -unusual swelling of the legs -vomiting Side effects that usually do not require medical attention (report to your doctor or health care professional if they continue or are bothersome): -blurred vision or other eye problems -changes in sexual ability or desire -constipation or diarrhea -difficulty sleeping -dry mouth or unpleasant taste -headache -nausea This list may not describe all possible side effects. Call your doctor for medical advice about side effects. You may report side effects to FDA at 1-800-FDA-1088. Where should I keep my medicine? Keep out of the reach of children. This medicine can be abused. Keep your medicine in a safe place to protect it from theft. Do not share this medicine with anyone. Selling or giving away this medicine is dangerous and against the law. This medicine may cause accidental overdose and death if taken by other adults, children, or pets. Mix any unused medicine with a substance like cat litter or coffee grounds. Then throw the medicine away in a sealed container like a sealed bag or a coffee can with a lid. Do not use the medicine after the expiration date. Store at room temperature between 20 and 25 degrees C (68 and 77 degrees F). Keep container tightly closed. NOTE: This sheet is a summary. It may not cover all possible information. If you have questions about this medicine, talk to your doctor, pharmacist, or health care provider.  2018 Elsevier/Gold Standard (2015-03-09 12:53:15)    Rivaroxaban oral tablets What is this medicine? RIVAROXABAN (ri va ROX a ban) is an anticoagulant (blood thinner). It is used to treat blood clots in the lungs or in the veins. It is also used after knee or hip surgeries to  prevent blood clots. It is also used to lower the chance of stroke in people with a medical condition called atrial fibrillation. This medicine may be used for other purposes; ask your health care provider or pharmacist if you have questions. COMMON BRAND NAME(S): Xarelto, Xarelto Starter Pack What should I tell my health care provider before I take this medicine? They need to know if you have any of these conditions: -bleeding disorders -bleeding in the brain -blood in your stools (black or tarry stools) or if you have blood in your vomit -history of stomach bleeding -kidney disease -liver disease -low blood counts, like low white cell, platelet, or red cell counts -recent or planned spinal or epidural procedure -take medicines that treat or prevent blood clots -an unusual or allergic reaction to rivaroxaban, other medicines, foods, dyes, or preservatives -pregnant or trying to get pregnant -breast-feeding How should I use this medicine? Take this medicine by mouth with a glass of water. Follow the directions on the prescription label. Take your medicine at regular intervals. Do not take it more often than directed. Do not stop taking except on your doctor's advice. Stopping this medicine may increase your risk of a blood clot. Be sure to refill your prescription before you run out of medicine. If you are taking this medicine after hip or knee replacement surgery, take it with or without food. If you are taking this medicine for atrial fibrillation, take it with your evening meal. If you are taking this medicine  to treat blood clots, take it with food at the same time each day. If you are unable to swallow your tablet, you may crush the tablet and mix it in applesauce. Then, immediately eat the applesauce. You should eat more food right after you eat the applesauce containing the crushed tablet. Talk to your pediatrician regarding the use of this medicine in children. Special care may be  needed. Overdosage: If you think you have taken too much of this medicine contact a poison control center or emergency room at once. NOTE: This medicine is only for you. Do not share this medicine with others. What if I miss a dose? If you take your medicine once a day and miss a dose, take the missed dose as soon as you remember. If you take your medicine twice a day and miss a dose, take the missed dose immediately. In this instance, 2 tablets may be taken at the same time. The next day you should take 1 tablet twice a day as directed. What may interact with this medicine? Do not take this medicine with any of the following medications: -defibrotide This medicine may also interact with the following medications: -aspirin and aspirin-like medicines -certain antibiotics like erythromycin, azithromycin, and clarithromycin -certain medicines for fungal infections like ketoconazole and itraconazole -certain medicines for irregular heart beat like amiodarone, quinidine, dronedarone -certain medicines for seizures like carbamazepine, phenytoin -certain medicines that treat or prevent blood clots like warfarin, enoxaparin, and dalteparin -conivaptan -diltiazem -felodipine -indinavir -lopinavir; ritonavir -NSAIDS, medicines for pain and inflammation, like ibuprofen or naproxen -ranolazine -rifampin -ritonavir -SNRIs, medicines for depression, like desvenlafaxine, duloxetine, levomilnacipran, venlafaxine -SSRIs, medicines for depression, like citalopram, escitalopram, fluoxetine, fluvoxamine, paroxetine, sertraline -St. John's wort -verapamil This list may not describe all possible interactions. Give your health care provider a list of all the medicines, herbs, non-prescription drugs, or dietary supplements you use. Also tell them if you smoke, drink alcohol, or use illegal drugs. Some items may interact with your medicine. What should I watch for while using this medicine? Visit your doctor or  health care professional for regular checks on your progress. Notify your doctor or health care professional and seek emergency treatment if you develop breathing problems; changes in vision; chest pain; severe, sudden headache; pain, swelling, warmth in the leg; trouble speaking; sudden numbness or weakness of the face, arm or leg. These can be signs that your condition has gotten worse. If you are going to have surgery or other procedure, tell your doctor that you are taking this medicine. What side effects may I notice from receiving this medicine? Side effects that you should report to your doctor or health care professional as soon as possible: -allergic reactions like skin rash, itching or hives, swelling of the face, lips, or tongue -back pain -redness, blistering, peeling or loosening of the skin, including inside the mouth -signs and symptoms of bleeding such as bloody or black, tarry stools; red or dark-brown urine; spitting up blood or brown material that looks like coffee grounds; red spots on the skin; unusual bruising or bleeding from the eye, gums, or nose Side effects that usually do not require medical attention (report to your doctor or health care professional if they continue or are bothersome): -dizziness -muscle pain This list may not describe all possible side effects. Call your doctor for medical advice about side effects. You may report side effects to FDA at 1-800-FDA-1088. Where should I keep my medicine? Keep out of the reach of children. Store  at room temperature between 15 and 30 degrees C (59 and 86 degrees F). Throw away any unused medicine after the expiration date. NOTE: This sheet is a summary. It may not cover all possible information. If you have questions about this medicine, talk to your doctor, pharmacist, or health care provider.  2018 Elsevier/Gold Standard (2016-02-20 16:29:33)

## 2018-01-21 ENCOUNTER — Ambulatory Visit (INDEPENDENT_AMBULATORY_CARE_PROVIDER_SITE_OTHER): Payer: Medicaid Other | Admitting: Physician Assistant

## 2018-01-22 ENCOUNTER — Encounter (HOSPITAL_COMMUNITY): Payer: Self-pay

## 2018-01-22 ENCOUNTER — Ambulatory Visit (HOSPITAL_COMMUNITY)
Admission: EM | Admit: 2018-01-22 | Discharge: 2018-01-22 | Disposition: A | Discharge status: Home / Self Care | Payer: Medicaid Other | Attending: Family Medicine | Admitting: Family Medicine

## 2018-01-22 DIAGNOSIS — L03211 Cellulitis of face: Secondary | ICD-10-CM | POA: Diagnosis not present

## 2018-01-22 DIAGNOSIS — H6123 Impacted cerumen, bilateral: Secondary | ICD-10-CM | POA: Diagnosis not present

## 2018-01-22 MED ORDER — DOXYCYCLINE HYCLATE 100 MG PO TABS: 100.0000 mg | ORAL_TABLET | Freq: Two times a day (BID) | ORAL | 0 refills | Status: DC

## 2018-01-22 NOTE — ED Provider Notes (Signed)
[facility]    CSN: [id number] Arrival date & time: 01/22/18  1122     History   Chief Complaint Chief Complaint  Patient presents with  . Appointment  . APPT 11:30 Sinus Infection    HPI Isabel Lewis is a 37 y.o. female.   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  She says in particular the inside of her nasal passage on the right is very tender.  Patient has no epistaxis, sore throat, or fever.  She notes that she has chronic cerumen impaction and is having some difficulty hearing. History reviewed. No pertinent past medical history.  Patient Active Problem List   Diagnosis Date Noted  . SIRS (systemic inflammatory response syndrome) (HCC) 09/17/2017  . Infection of obstetric surgical wound, superficial incisional site 09/16/2017  . Pulmonary embolism, delivered, with postpartum complication 09/15/2017  . S/P hysterectomy 09/15/2017  . Chest pain 09/15/2017  . Gestational diabetes mellitus (GDM), antepartum 08/05/2017  . Supervision of high risk pregnancy, antepartum 07/29/2017  . Anterior placenta previa 07/29/2017  . History of cesarean delivery x 5, currently pregnant 07/29/2017  . Rh negative state in antepartum period 07/29/2017  . Antepartum multigravida of advanced maternal age 52/16/2019    Past Surgical History:  Procedure Laterality Date  . CESAREAN SECTION     x5    OB History    Gravida  8   Para  5   Term  5   Preterm      AB  2   Living  5     SAB  1   TAB      Ectopic      Multiple      Live Births  5            Home Medications    Prior to Admission medications   Medication Sig Start Date End Date Taking? Authorizing Provider  doxycycline (VIBRA-TABS) 100 MG tablet Take 1 tablet (100 mg total) by mouth 2 (two) times daily. 01/22/18   Elvina Sidle, MD  phentermine 15 MG capsule Take 1 capsule (15 mg total) by mouth  every morning. 12/21/17   Loletta Specter, PA-C  rivaroxaban (XARELTO) 20 MG TABS tablet Take 1 tablet (20 mg total) by mouth daily with supper. 12/21/17 03/21/18  Loletta Specter, PA-C    Family History History reviewed. No pertinent family history.  Social History Social History   Tobacco Use  . Smoking status: Never Smoker  . Smokeless tobacco: Never Used  Substance Use Topics  . Alcohol use: No    Frequency: Never  . Drug use: No     Allergies   Patient has no known allergies.   Review of Systems Review of Systems   Physical Exam Triage Vital Signs ED Triage Vitals [01/22/18 1134]  Enc Vitals Group     BP (!) 136/93     Pulse Rate 84     Resp 20     Temp 98.3 F (36.8 C)     Temp Source Oral     SpO2 95 %     Weight      Height      Head Circumference      Peak Flow      Pain Score      Pain Loc      Pain Edu?      Excl. in GC?    No  data found.  Updated Vital Signs BP (!) 136/93 (BP Location: Right Arm)   Pulse 84   Temp 98.3 F (36.8 C) (Oral)   Resp 20   LMP 01/05/2017 (LMP Unknown)   SpO2 95%   Physical Exam  Constitutional: She is oriented to person, place, and time. She appears well-developed and well-nourished.  HENT:  Head: Normocephalic and atraumatic.  Bilateral cerumen impactions  Examination of the inside nasal passage on the right reveals a very red and swollen papular nodule laterally about 1 cm from the orifice.  Eyes: Pupils are equal, round, and reactive to light. Conjunctivae and EOM are normal.  Neck: Normal range of motion. Neck supple.  Pulmonary/Chest: Effort normal.  Musculoskeletal: Normal range of motion.  Neurological: She is alert and oriented to person, place, and time.  Skin:  Mild erythema and swelling on the right side of the external nose.    Nursing note and vitals reviewed.    UC Treatments / Results  Labs (all labs ordered are listed, but only abnormal results are displayed) Labs Reviewed - No  data to display  EKG None  Radiology No results found.  Procedures Procedures (including critical care time)  Medications Ordered in UC Medications - No data to display  Initial Impression / Assessment and Plan / UC Course  I have reviewed the triage vital signs and the nursing notes.  Pertinent labs & imaging results that were available during my care of the patient were reviewed by me and considered in my medical decision making (see chart for details).    Final Clinical Impressions(s) / UC Diagnoses   Final diagnoses:  Facial cellulitis  Bilateral impacted cerumen   Discharge Instructions   None    ED Prescriptions    Medication Sig Dispense Auth. Provider   doxycycline (VIBRA-TABS) 100 MG tablet Take 1 tablet (100 mg total) by mouth 2 (two) times daily. 20 tablet Elvina SidleLauenstein, Brad Lieurance, MD     Controlled Substance Prescriptions [place] Controlled Substance Registry consulted? Not Applicable   Elvina SidleLauenstein, Merrick Feutz, MD 01/22/18 1149

## 2018-01-22 NOTE — ED Notes (Signed)
Bed: UC01 Expected date: 01/22/18 Expected time:  Means of arrival:  Comments: For APPTS

## 2018-01-22 NOTE — ED Triage Notes (Signed)
Pt presents with sinus symptoms

## 2018-02-18 ENCOUNTER — Ambulatory Visit (INDEPENDENT_AMBULATORY_CARE_PROVIDER_SITE_OTHER): Payer: Medicaid Other | Admitting: Physician Assistant

## 2018-04-19 ENCOUNTER — Ambulatory Visit (INDEPENDENT_AMBULATORY_CARE_PROVIDER_SITE_OTHER): Payer: Medicaid Other | Admitting: Physician Assistant

## 2018-04-20 ENCOUNTER — Ambulatory Visit: Payer: Medicaid Other | Admitting: Obstetrics and Gynecology

## 2018-04-26 ENCOUNTER — Ambulatory Visit (INDEPENDENT_AMBULATORY_CARE_PROVIDER_SITE_OTHER): Payer: Medicaid Other

## 2018-05-04 ENCOUNTER — Ambulatory Visit: Payer: Medicaid Other | Admitting: Obstetrics and Gynecology

## 2018-05-28 ENCOUNTER — Ambulatory Visit (INDEPENDENT_AMBULATORY_CARE_PROVIDER_SITE_OTHER): Payer: Medicaid Other | Admitting: Physician Assistant

## 2018-06-15 ENCOUNTER — Ambulatory Visit (INDEPENDENT_AMBULATORY_CARE_PROVIDER_SITE_OTHER): Payer: Medicaid Other | Admitting: Physician Assistant

## 2018-06-17 ENCOUNTER — Encounter (INDEPENDENT_AMBULATORY_CARE_PROVIDER_SITE_OTHER): Payer: Self-pay | Admitting: Physician Assistant

## 2018-06-17 ENCOUNTER — Other Ambulatory Visit: Payer: Self-pay

## 2018-06-17 ENCOUNTER — Ambulatory Visit (INDEPENDENT_AMBULATORY_CARE_PROVIDER_SITE_OTHER): Payer: Medicaid Other | Admitting: Physician Assistant

## 2018-06-17 VITALS — BP 148/97 | HR 69 | Temp 97.8°F | Ht <= 58 in | Wt 242.4 lb

## 2018-06-17 DIAGNOSIS — Z6841 Body Mass Index (BMI) 40.0 and over, adult: Secondary | ICD-10-CM

## 2018-06-17 DIAGNOSIS — I10 Essential (primary) hypertension: Secondary | ICD-10-CM

## 2018-06-17 MED ORDER — LISINOPRIL 10 MG PO TABS: 10.0000 mg | ORAL_TABLET | Freq: Every day | ORAL | 2 refills | Status: DC

## 2018-06-17 MED ORDER — HYDROCHLOROTHIAZIDE 12.5 MG PO TABS: 12.5000 mg | ORAL_TABLET | Freq: Every day | ORAL | 2 refills | Status: DC

## 2018-06-17 NOTE — Patient Instructions (Signed)
Hypertension Hypertension, commonly called high blood pressure, is when the force of blood pumping through the arteries is too strong. The arteries are the blood vessels that carry blood from the heart throughout the body. Hypertension forces the heart to work harder to pump blood and may cause arteries to become narrow or stiff. Having untreated or uncontrolled hypertension can cause heart attacks, strokes, kidney disease, and other problems. A blood pressure reading consists of a higher number over a lower number. Ideally, your blood pressure should be below 120/80. The first ("top") number is called the systolic pressure. It is a measure of the pressure in your arteries as your heart beats. The second ("bottom") number is called the diastolic pressure. It is a measure of the pressure in your arteries as the heart relaxes. What are the causes? The cause of this condition is not known. What increases the risk? Some risk factors for high blood pressure are under your control. Others are not. Factors you can change  Smoking.  Having type 2 diabetes mellitus, high cholesterol, or both.  Not getting enough exercise or physical activity.  Being overweight.  Having too much fat, sugar, calories, or salt (sodium) in your diet.  Drinking too much alcohol. Factors that are difficult or impossible to change  Having chronic kidney disease.  Having a family history of high blood pressure.  Age. Risk increases with age.  Race. You may be at higher risk if you are African-American.  Gender. Men are at higher risk than women before age 45. After age 65, women are at higher risk than men.  Having obstructive sleep apnea.  Stress. What are the signs or symptoms? Extremely high blood pressure (hypertensive crisis) may cause:  Headache.  Anxiety.  Shortness of breath.  Nosebleed.  Nausea and vomiting.  Severe chest pain.  Jerky movements you cannot control (seizures). How is this  diagnosed? This condition is diagnosed by measuring your blood pressure while you are seated, with your arm resting on a surface. The cuff of the blood pressure monitor will be placed directly against the skin of your upper arm at the level of your heart. It should be measured at least twice using the same arm. Certain conditions can cause a difference in blood pressure between your right and left arms. Certain factors can cause blood pressure readings to be lower or higher than normal (elevated) for a short period of time:  When your blood pressure is higher when you are in a health care provider's office than when you are at home, this is called white coat hypertension. Most people with this condition do not need medicines.  When your blood pressure is higher at home than when you are in a health care provider's office, this is called masked hypertension. Most people with this condition may need medicines to control blood pressure. If you have a high blood pressure reading during one visit or you have normal blood pressure with other risk factors:  You may be asked to return on a different day to have your blood pressure checked again.  You may be asked to monitor your blood pressure at home for 1 week or longer. If you are diagnosed with hypertension, you may have other blood or imaging tests to help your health care provider understand your overall risk for other conditions. How is this treated? This condition is treated by making healthy lifestyle changes, such as eating healthy foods, exercising more, and reducing your alcohol intake. Your health care provider   may prescribe medicine if lifestyle changes are not enough to get your blood pressure under control, and if:  Your systolic blood pressure is above 130.  Your diastolic blood pressure is above 80. Your personal target blood pressure may vary depending on your medical conditions, your age, and other factors. Follow these instructions  at home: Eating and drinking   Eat a diet that is high in fiber and potassium, and low in sodium, added sugar, and fat. An example eating plan is called the DASH (Dietary Approaches to Stop Hypertension) diet. To eat this way: ? Eat plenty of fresh fruits and vegetables. Try to fill half of your plate at each meal with fruits and vegetables. ? Eat whole grains, such as whole wheat pasta, brown rice, or whole grain bread. Fill about one quarter of your plate with whole grains. ? Eat or drink low-fat dairy products, such as skim milk or low-fat yogurt. ? Avoid fatty cuts of meat, processed or cured meats, and poultry with skin. Fill about one quarter of your plate with lean proteins, such as fish, chicken without skin, beans, eggs, and tofu. ? Avoid premade and processed foods. These tend to be higher in sodium, added sugar, and fat.  Reduce your daily sodium intake. Most people with hypertension should eat less than 1,500 mg of sodium a day.  Limit alcohol intake to no more than 1 drink a day for nonpregnant women and 2 drinks a day for men. One drink equals 12 oz of beer, 5 oz of wine, or 1 oz of hard liquor. Lifestyle   Work with your health care provider to maintain a healthy body weight or to lose weight. Ask what an ideal weight is for you.  Get at least 30 minutes of exercise that causes your heart to beat faster (aerobic exercise) most days of the week. Activities may include walking, swimming, or biking.  Include exercise to strengthen your muscles (resistance exercise), such as pilates or lifting weights, as part of your weekly exercise routine. Try to do these types of exercises for 30 minutes at least 3 days a week.  Do not use any products that contain nicotine or tobacco, such as cigarettes and e-cigarettes. If you need help quitting, ask your health care provider.  Monitor your blood pressure at home as told by your health care provider.  Keep all follow-up visits as told by  your health care provider. This is important. Medicines  Take over-the-counter and prescription medicines only as told by your health care provider. Follow directions carefully. Blood pressure medicines must be taken as prescribed.  Do not skip doses of blood pressure medicine. Doing this puts you at risk for problems and can make the medicine less effective.  Ask your health care provider about side effects or reactions to medicines that you should watch for. Contact a health care provider if:  You think you are having a reaction to a medicine you are taking.  You have headaches that keep coming back (recurring).  You feel dizzy.  You have swelling in your ankles.  You have trouble with your vision. Get help right away if:  You develop a severe headache or confusion.  You have unusual weakness or numbness.  You feel faint.  You have severe pain in your chest or abdomen.  You vomit repeatedly.  You have trouble breathing. Summary  Hypertension is when the force of blood pumping through your arteries is too strong. If this condition is not controlled, it   may put you at risk for serious complications.  Your personal target blood pressure may vary depending on your medical conditions, your age, and other factors. For most people, a normal blood pressure is less than 120/80.  Hypertension is treated with lifestyle changes, medicines, or a combination of both. Lifestyle changes include weight loss, eating a healthy, low-sodium diet, exercising more, and limiting alcohol. This information is not intended to replace advice given to you by your health care provider. Make sure you discuss any questions you have with your health care provider. Document Released: 06/02/2005 Document Revised: 04/30/2016 Document Reviewed: 04/30/2016 Elsevier Interactive Patient Education  2019 [employer] DASH Eating Plan DASH stands for "Dietary Approaches to Stop Hypertension." The DASH eating  plan is a healthy eating plan that has been shown to reduce high blood pressure (hypertension). It may also reduce your risk for type 2 diabetes, heart disease, and stroke. The DASH eating plan may also help with weight loss. What are tips for following this plan?  General guidelines  Avoid eating more than 2,300 mg (milligrams) of salt (sodium) a day. If you have hypertension, you may need to reduce your sodium intake to 1,500 mg a day.  Limit alcohol intake to no more than 1 drink a day for nonpregnant women and 2 drinks a day for men. One drink equals 12 oz of beer, 5 oz of wine, or 1 oz of hard liquor.  Work with your health care provider to maintain a healthy body weight or to lose weight. Ask what an ideal weight is for you.  Get at least 30 minutes of exercise that causes your heart to beat faster (aerobic exercise) most days of the week. Activities may include walking, swimming, or biking.  Work with your health care provider or diet and nutrition specialist (dietitian) to adjust your eating plan to your individual calorie needs. Reading food labels   Check food labels for the amount of sodium per serving. Choose foods with less than 5 percent of the Daily Value of sodium. Generally, foods with less than 300 mg of sodium per serving fit into this eating plan.  To find whole grains, look for the word "whole" as the first word in the ingredient list. Shopping  Buy products labeled as "low-sodium" or "no salt added."  Buy fresh foods. Avoid canned foods and premade or frozen meals. Cooking  Avoid adding salt when cooking. Use salt-free seasonings or herbs instead of table salt or sea salt. Check with your health care provider or pharmacist before using salt substitutes.  Do not fry foods. Cook foods using healthy methods such as baking, boiling, grilling, and broiling instead.  Cook with heart-healthy oils, such as olive, canola, soybean, or sunflower oil. Meal planning  Eat a  balanced diet that includes: ? 5 or more servings of fruits and vegetables each day. At each meal, try to fill half of your plate with fruits and vegetables. ? Up to 6-8 servings of whole grains each day. ? Less than 6 oz of lean meat, poultry, or fish each day. A 3-oz serving of meat is about the same size as a deck of cards. One egg equals 1 oz. ? 2 servings of low-fat dairy each day. ? A serving of nuts, seeds, or beans 5 times each week. ? Heart-healthy fats. Healthy fats called Omega-3 fatty acids are found in foods such as flaxseeds and coldwater fish, like sardines, salmon, and mackerel.  Limit how much you eat of the following: ?   Canned or prepackaged foods. ? Food that is high in trans fat, such as fried foods. ? Food that is high in saturated fat, such as fatty meat. ? Sweets, desserts, sugary drinks, and other foods with added sugar. ? Full-fat dairy products.  Do not salt foods before eating.  Try to eat at least 2 vegetarian meals each week.  Eat more home-cooked food and less restaurant, buffet, and fast food.  When eating at a restaurant, ask that your food be prepared with less salt or no salt, if possible. What foods are recommended? The items listed may not be a complete list. Talk with your dietitian about what dietary choices are best for you. Grains Whole-grain or whole-wheat bread. Whole-grain or whole-wheat pasta. Brown rice. Oatmeal. Quinoa. Bulgur. Whole-grain and low-sodium cereals. Pita bread. Low-fat, low-sodium crackers. Whole-wheat flour tortillas. Vegetables Fresh or frozen vegetables (raw, steamed, roasted, or grilled). Low-sodium or reduced-sodium tomato and vegetable juice. Low-sodium or reduced-sodium tomato sauce and tomato paste. Low-sodium or reduced-sodium canned vegetables. Fruits All fresh, dried, or frozen fruit. Canned fruit in natural juice (without added sugar). Meat and other protein foods Skinless chicken or turkey. Ground chicken or  turkey. Pork with fat trimmed off. Fish and seafood. Egg whites. Dried beans, peas, or lentils. Unsalted nuts, nut butters, and seeds. Unsalted canned beans. Lean cuts of beef with fat trimmed off. Low-sodium, lean deli meat. Dairy Low-fat (1%) or fat-free (skim) milk. Fat-free, low-fat, or reduced-fat cheeses. Nonfat, low-sodium ricotta or cottage cheese. Low-fat or nonfat yogurt. Low-fat, low-sodium cheese. Fats and oils Soft margarine without trans fats. Vegetable oil. Low-fat, reduced-fat, or light mayonnaise and salad dressings (reduced-sodium). Canola, safflower, olive, soybean, and sunflower oils. Avocado. Seasoning and other foods Herbs. Spices. Seasoning mixes without salt. Unsalted popcorn and pretzels. Fat-free sweets. What foods are not recommended? The items listed may not be a complete list. Talk with your dietitian about what dietary choices are best for you. Grains Baked goods made with fat, such as croissants, muffins, or some breads. Dry pasta or rice meal packs. Vegetables Creamed or fried vegetables. Vegetables in a cheese sauce. Regular canned vegetables (not low-sodium or reduced-sodium). Regular canned tomato sauce and paste (not low-sodium or reduced-sodium). Regular tomato and vegetable juice (not low-sodium or reduced-sodium). Pickles. Olives. Fruits Canned fruit in a light or heavy syrup. Fried fruit. Fruit in cream or butter sauce. Meat and other protein foods Fatty cuts of meat. Ribs. Fried meat. Bacon. Sausage. Bologna and other processed lunch meats. Salami. Fatback. Hotdogs. Bratwurst. Salted nuts and seeds. Canned beans with added salt. Canned or smoked fish. Whole eggs or egg yolks. Chicken or turkey with skin. Dairy Whole or 2% milk, cream, and half-and-half. Whole or full-fat cream cheese. Whole-fat or sweetened yogurt. Full-fat cheese. Nondairy creamers. Whipped toppings. Processed cheese and cheese spreads. Fats and oils Butter. Stick margarine. Lard.  Shortening. Ghee. Bacon fat. Tropical oils, such as coconut, palm kernel, or palm oil. Seasoning and other foods Salted popcorn and pretzels. Onion salt, garlic salt, seasoned salt, table salt, and sea salt. Worcestershire sauce. Tartar sauce. Barbecue sauce. Teriyaki sauce. Soy sauce, including reduced-sodium. Steak sauce. Canned and packaged gravies. Fish sauce. Oyster sauce. Cocktail sauce. Horseradish that you find on the shelf. Ketchup. Mustard. Meat flavorings and tenderizers. Bouillon cubes. Hot sauce and Tabasco sauce. Premade or packaged marinades. Premade or packaged taco seasonings. Relishes. Regular salad dressings. Where to find more information:  National Heart, Lung, and Blood Institute: www.nhlbi.nih.gov  American Heart Association: www.heart.org Summary    The DASH eating plan is a healthy eating plan that has been shown to reduce high blood pressure (hypertension). It may also reduce your risk for type 2 diabetes, heart disease, and stroke.  With the DASH eating plan, you should limit salt (sodium) intake to 2,300 mg a day. If you have hypertension, you may need to reduce your sodium intake to 1,500 mg a day.  When on the DASH eating plan, aim to eat more fresh fruits and vegetables, whole grains, lean proteins, low-fat dairy, and heart-healthy fats.  Work with your health care provider or diet and nutrition specialist (dietitian) to adjust your eating plan to your individual calorie needs. This information is not intended to replace advice given to you by your health care provider. Make sure you discuss any questions you have with your health care provider. Document Released: 05/22/2011 Document Revised: 05/26/2016 Document Reviewed: 05/26/2016 Elsevier Interactive Patient Education  2019 [employer] Inc[employer]  

## 2018-06-17 NOTE — Progress Notes (Signed)
Subjective:  Patient ID: Isabel Lewis, female    DOB: 27-Apr-1981  Age: 38 y.o. MRN: [id number]  CC: obesity,   HPI Isabel Rosalesdna Howardis a 38 y.o.femalewith amedical historyof placenta previa presents to discuss obesity. Last seen here nearly six months ago when she complained about her weight. Weighed 238 lbs and BP 133/87 mmg at the time. Phentermine 15 mg prescribed and instructed to return in one month which she failed to do because she was going through a divorce. Weighs 242 lbs today. BP noted to be 155/100 mmHg initially. 148/97 mmHg on retake. Does not endorse CP, palpitations, SOB, HA, abdominal pain, f/c/n/v, LE edema, rash, or GI/GU sxs.     Outpatient Medications Prior to Visit  Medication Sig Dispense Refill  . phentermine 15 MG capsule Take 1 capsule (15 mg total) by mouth every morning. (Patient not taking: Reported on 06/17/2018) 30 capsule 0  . rivaroxaban (XARELTO) 20 MG TABS tablet Take 1 tablet (20 mg total) by mouth daily with supper. 90 tablet 1  . doxycycline (VIBRA-TABS) 100 MG tablet Take 1 tablet (100 mg total) by mouth 2 (two) times daily. 20 tablet 0   No facility-administered medications prior to visit.      ROS Review of Systems  Constitutional: Negative for chills, fever and malaise/fatigue.  Eyes: Negative for blurred vision.  Respiratory: Negative for shortness of breath.   Cardiovascular: Negative for chest pain and palpitations.  Gastrointestinal: Negative for abdominal pain and nausea.  Genitourinary: Negative for dysuria and hematuria.  Musculoskeletal: Negative for joint pain and myalgias.  Skin: Negative for rash.  Neurological: Negative for tingling and headaches.  Psychiatric/Behavioral: Negative for depression. The patient is not nervous/anxious.     Objective:  BP (!) 155/100 (BP Location: Right Arm, Patient Position: Sitting, Cuff Size: Large)   Pulse 75   Temp 97.8 F (36.6 C) (Oral)   Ht 4\' 10"  (1.473 m)   Wt 242 lb 6.4 oz (110 kg)    LMP 01/05/2017 (LMP Unknown)   SpO2 98%   BMI 50.66 kg/m   BP/Weight 06/17/2018 01/22/2018 12/21/2017  Systolic BP 155 136 133  Diastolic BP 100 93 87  Wt. (Lbs) 242.4 - 242.2  BMI 50.66 - 50.62      Physical Exam Vitals signs reviewed.  Constitutional:      Comments: Well developed, obese, NAD, polite  HENT:     Head: Normocephalic and atraumatic.  Eyes:     General: No scleral icterus. Neck:     Musculoskeletal: Normal range of motion and neck supple.     Thyroid: No thyromegaly.  Cardiovascular:     Rate and Rhythm: Normal rate and regular rhythm.     Heart sounds: Normal heart sounds.     Comments: Trace pitting edema of the LE bilaterally.  Pulmonary:     Effort: Pulmonary effort is normal.     Breath sounds: Normal breath sounds.  Skin:    General: Skin is warm and dry.     Coloration: Skin is not pale.     Findings: No erythema or rash.  Neurological:     Mental Status: She is alert and oriented to person, place, and time.  Psychiatric:        Behavior: Behavior normal.        Thought Content: Thought content normal.      Assessment & Plan:    1. Essential hypertension - hydrochlorothiazide (HYDRODIURIL) 12.5 MG tablet; Take 1 tablet (12.5 mg total) by mouth daily.  Dispense: 30 tablet; Refill: 2 - lisinopril (PRINIVIL,ZESTRIL) 10 MG tablet; Take 1 tablet (10 mg total) by mouth daily.  Dispense: 30 tablet; Refill: 2  2. Class 3 severe obesity without serious comorbidity with body mass index (BMI) of 50.0 to 59.9 in adult, unspecified obesity type (HCC) - Pt advised to lower her blood pressure before considering use of phentermine due to the possible increase in BP, HR and the detrimental effects on the heart.    Meds ordered this encounter  Medications  . hydrochlorothiazide (HYDRODIURIL) 12.5 MG tablet    Sig: Take 1 tablet (12.5 mg total) by mouth daily.    Dispense:  30 tablet    Refill:  2    Order Specific Question:   Supervising Provider     Answer:   Hoy Register [[id number]]  . lisinopril (PRINIVIL,ZESTRIL) 10 MG tablet    Sig: Take 1 tablet (10 mg total) by mouth daily.    Dispense:  30 tablet    Refill:  2    Order Specific Question:   Supervising Provider    Answer:   Hoy Register [[id number]]    Follow-up: Return in about 4 weeks (around 07/15/2018) for HTN.   Loletta Specter PA

## 2018-07-15 ENCOUNTER — Ambulatory Visit (INDEPENDENT_AMBULATORY_CARE_PROVIDER_SITE_OTHER): Payer: Medicaid Other | Admitting: Family Medicine

## 2018-08-10 ENCOUNTER — Encounter (INDEPENDENT_AMBULATORY_CARE_PROVIDER_SITE_OTHER): Payer: Self-pay | Admitting: Primary Care

## 2018-08-10 ENCOUNTER — Ambulatory Visit (INDEPENDENT_AMBULATORY_CARE_PROVIDER_SITE_OTHER): Payer: Medicaid Other | Admitting: Primary Care

## 2018-08-10 ENCOUNTER — Other Ambulatory Visit: Payer: Self-pay

## 2018-08-10 VITALS — BP 102/64 | HR 73 | Temp 97.5°F | Ht <= 58 in | Wt 242.8 lb

## 2018-08-10 DIAGNOSIS — O8823 Thromboembolism in the puerperium: Secondary | ICD-10-CM

## 2018-08-10 DIAGNOSIS — I10 Essential (primary) hypertension: Secondary | ICD-10-CM | POA: Diagnosis not present

## 2018-08-10 DIAGNOSIS — Z6841 Body Mass Index (BMI) 40.0 and over, adult: Secondary | ICD-10-CM

## 2018-08-10 MED ORDER — PHENTERMINE HCL 37.5 MG PO CAPS: 37.5000 mg | ORAL_CAPSULE | ORAL | 1 refills | Status: DC

## 2018-08-10 NOTE — Progress Notes (Signed)
Established Patient Office Visit  Subjective:  Patient ID: Isabel Lewis, female    DOB: 12-15-1980  Age: 38 y.o. MRN: [id number]  CC:  Chief Complaint  Patient presents with  . Follow-up    htn  . Weight Loss    phentermine    HPI Isabel Lewis presents for follow up on HTN and weight loss management. Patient pastmedical historyof placenta previa.Pulmonary embolism, delivered, with postpartum complication Previous provider told her he would prescribe phentermine if her Bp was controlled. I included an EKG reviewed in her chart 09/15/17 NSR, nonspecific ST abnormity, Abnormal EKG. On f/u will repeat to r/o cardiac changes.    History reviewed. No pertinent past medical history.  Past Surgical History:  Procedure Laterality Date  . CESAREAN SECTION     x5    History reviewed. No pertinent family history.  Social History   Socioeconomic History  . Marital status: Married    Spouse name: Not on file  . Number of children: Not on file  . Years of education: Not on file  . Highest education level: Not on file  Occupational History  . Not on file  Social Needs  . Financial resource strain: Not on file  . Food insecurity:    Worry: Not on file    Inability: Not on file  . Transportation needs:    Medical: Not on file    Non-medical: Not on file  Tobacco Use  . Smoking status: Never Smoker  . Smokeless tobacco: Never Used  Substance and Sexual Activity  . Alcohol use: No    Frequency: Never  . Drug use: No  . Sexual activity: Not on file  Lifestyle  . Physical activity:    Days per week: Not on file    Minutes per session: Not on file  . Stress: Not on file  Relationships  . Social connections:    Talks on phone: Not on file    Gets together: Not on file    Attends religious service: Not on file    Active member of club or organization: Not on file    Attends meetings of clubs or organizations: Not on file    Relationship status: Not on file  . Intimate  partner violence:    Fear of current or ex partner: Not on file    Emotionally abused: Not on file    Physically abused: Not on file    Forced sexual activity: Not on file  Other Topics Concern  . Not on file  Social History Narrative  . Not on file    Outpatient Medications Prior to Visit  Medication Sig Dispense Refill  . hydrochlorothiazide (HYDRODIURIL) 12.5 MG tablet Take 1 tablet (12.5 mg total) by mouth daily. 30 tablet 2  . lisinopril (PRINIVIL,ZESTRIL) 10 MG tablet Take 1 tablet (10 mg total) by mouth daily. 30 tablet 2  . rivaroxaban (XARELTO) 20 MG TABS tablet Take 1 tablet (20 mg total) by mouth daily with supper. 90 tablet 1   No facility-administered medications prior to visit.     No Known Allergies  ROS Review of Systems    Objective:    Physical Exam  Constitutional: She is oriented to person, place, and time. She appears well-developed and well-nourished.  HENT:  Head: Normocephalic.  Eyes: Pupils are equal, round, and reactive to light. EOM are normal.  Neck: Normal range of motion. Neck supple.  Cardiovascular: Normal rate and regular rhythm.  Pulmonary/Chest: Effort normal and breath sounds normal.  Abdominal: Soft. Bowel sounds are normal.  Musculoskeletal: Normal range of motion.  Neurological: She is alert and oriented to person, place, and time.  Skin: Skin is warm and dry.  Psychiatric: She has a normal mood and affect.    BP 102/64 (BP Location: Right Arm, Patient Position: Sitting, Cuff Size: Large)   Pulse 73   Temp (!) 97.5 F (36.4 C) (Oral)   Ht 4\' 10"  (1.473 m)   Wt 242 lb 12.8 oz (110.1 kg)   LMP 01/05/2017 (LMP Unknown)   SpO2 98%   BMI 50.75 kg/m  Wt Readings from Last 3 Encounters:  08/10/18 242 lb 12.8 oz (110.1 kg)  06/17/18 242 lb 6.4 oz (110 kg)  12/21/17 242 lb 3.2 oz (109.9 kg)     There are no preventive care reminders to display for this patient.  There are no preventive care reminders to display for this  patient.  No results found for: TSH Lab Results  Component Value Date   WBC 5.6 10/20/2017   HGB 10.8 (L) 10/20/2017   HCT 34.5 10/20/2017   MCV 79 10/20/2017   PLT 308 10/20/2017   Lab Results  Component Value Date   NA 139 09/16/2017   K 3.6 09/16/2017   CO2 24 09/16/2017   GLUCOSE 99 09/16/2017   BUN 5 (L) 09/16/2017   CREATININE 0.65 09/16/2017   CALCIUM 8.4 (L) 09/16/2017   ANIONGAP 9 09/16/2017    Assessment & Plan:   Problem List Items Addressed This Visit    None    Isabel Lewis was seen today for follow-up and weight loss.  Diagnoses and all orders for this visit:  Essential hypertension  Class 3 severe obesity without serious comorbidity with body mass index (BMI) of 50.0 to 59.9 in adult, unspecified obesity type (HCC) Will prescribe weight loss medication phentermine d/w side effects   Pulmonary embolism, delivered, with postpartum complication Resolved   Other orders -     phentermine 37.5 MG capsule; Take 1 capsule (37.5 mg total) by mouth every morning for 30 days.  Follow-up: Return in about 4 weeks (around 09/07/2018) for f/u on medication and weight loss.    Grayce Sessions, NP

## 2018-08-10 NOTE — Patient Instructions (Signed)
Obesity, Adult Obesity is having too much body fat. If you have a BMI of 30 or more, you are obese. BMI is a number that explains how much body fat you have. Obesity is often caused by taking in (consuming) more calories than your body uses. Obesity can cause serious health problems. Changing your lifestyle can help to treat obesity. Follow these instructions at home: Eating and drinking   Follow advice from your doctor about what to eat and drink. Your doctor may tell you to: ? Cut down on (limit) fast foods, sweets, and processed snack foods. ? Choose low-fat options. For example, choose low-fat milk instead of whole milk. ? Eat 5 or more servings of fruits or vegetables every day. ? Eat at home more often. This gives you more control over what you eat. ? Choose healthy foods when you eat out. ? Learn what a healthy portion size is. A portion size is the amount of a certain food that is healthy for you to eat at one time. This is different for each person. ? Keep low-fat snacks available. ? Avoid sugary drinks. These include soda, fruit juice, iced tea that is sweetened with sugar, and flavored milk. ? Eat a healthy breakfast.  Drink enough water to keep your pee (urine) clear or pale yellow.  Do not go without eating for long periods of time (do not fast).  Do not go on popular or trendy diets (fad diets). Physical Activity  Exercise often, as told by your doctor. Ask your doctor: ? What types of exercise are safe for you. ? How often you should exercise.  Warm up and stretch before being active.  Do slow stretching after being active (cool down).  Rest between times of being active. Lifestyle  Limit how much time you spend in front of your TV, computer, or video game system (be less sedentary).  Find ways to reward yourself that do not involve food.  Limit alcohol intake to no more than 1 drink a day for nonpregnant women and 2 drinks a day for men. One drink equals 12 oz  of beer, 5 oz of wine, or 1 oz of hard liquor. General instructions  Keep a weight loss journal. This can help you keep track of: ? The food that you eat. ? The exercise that you do.  Take over-the-counter and prescription medicines only as told by your doctor.  Take vitamins and supplements only as told by your doctor.  Think about joining a support group. Your doctor may be able to help with this.  Keep all follow-up visits as told by your doctor. This is important. Contact a doctor if:  You cannot meet your weight loss goal after you have changed your diet and lifestyle for 6 weeks. This information is not intended to replace advice given to you by your health care provider. Make sure you discuss any questions you have with your health care provider. Document Released: 08/25/2011 Document Revised: 11/08/2015 Document Reviewed: 03/21/2015 Elsevier Interactive Patient Education  2019 [employer]  

## 2018-09-22 ENCOUNTER — Other Ambulatory Visit: Payer: Self-pay | Admitting: Pharmacist

## 2018-09-22 DIAGNOSIS — I10 Essential (primary) hypertension: Secondary | ICD-10-CM

## 2018-09-22 MED ORDER — LISINOPRIL 10 MG PO TABS: 10.0000 mg | ORAL_TABLET | Freq: Every day | ORAL | 2 refills | Status: DC

## 2018-09-22 MED ORDER — HYDROCHLOROTHIAZIDE 12.5 MG PO TABS: 12.5000 mg | ORAL_TABLET | Freq: Every day | ORAL | 2 refills | Status: DC

## 2018-11-24 ENCOUNTER — Ambulatory Visit (INDEPENDENT_AMBULATORY_CARE_PROVIDER_SITE_OTHER): Payer: Medicaid Other | Admitting: Primary Care

## 2018-11-26 ENCOUNTER — Ambulatory Visit: Payer: Medicaid Other | Admitting: Primary Care

## 2018-11-30 ENCOUNTER — Ambulatory Visit: Payer: Medicaid Other | Admitting: Primary Care

## 2018-12-01 ENCOUNTER — Encounter (INDEPENDENT_AMBULATORY_CARE_PROVIDER_SITE_OTHER): Payer: Self-pay | Admitting: Primary Care

## 2018-12-01 ENCOUNTER — Ambulatory Visit (INDEPENDENT_AMBULATORY_CARE_PROVIDER_SITE_OTHER): Payer: Medicaid Other | Admitting: Primary Care

## 2018-12-01 ENCOUNTER — Other Ambulatory Visit: Payer: Self-pay

## 2018-12-01 DIAGNOSIS — I10 Essential (primary) hypertension: Secondary | ICD-10-CM | POA: Diagnosis not present

## 2018-12-01 DIAGNOSIS — Z760 Encounter for issue of repeat prescription: Secondary | ICD-10-CM

## 2018-12-01 DIAGNOSIS — Z86711 Personal history of pulmonary embolism: Secondary | ICD-10-CM

## 2018-12-01 MED ORDER — PHENTERMINE HCL 37.5 MG PO CAPS: 37.5000 mg | ORAL_CAPSULE | ORAL | 1 refills | Status: DC

## 2018-12-01 NOTE — Patient Instructions (Signed)

## 2018-12-01 NOTE — Progress Notes (Signed)
Established Patient Office Visit  Subjective:  Patient ID: Isabel Lewis, female    DOB: January 23, 1981  Age: 38 y.o. MRN: [id number]  CC:  Chief Complaint  Patient presents with  . Weight Loss    Rx for phentermine     HPI Isabel Lewis presents for medication refill for weight loss.  She has currently been off phenetamine for several months. Requesting refills.  No past medical history on file.  Past Surgical History:  Procedure Laterality Date  . CESAREAN SECTION     x5    No family history on file.  Social History   Socioeconomic History  . Marital status: Married    Spouse name: Not on file  . Number of children: Not on file  . Years of education: Not on file  . Highest education level: Not on file  Occupational History  . Not on file  Social Needs  . Financial resource strain: Not on file  . Food insecurity    Worry: Not on file    Inability: Not on file  . Transportation needs    Medical: Not on file    Non-medical: Not on file  Tobacco Use  . Smoking status: Never Smoker  . Smokeless tobacco: Never Used  Substance and Sexual Activity  . Alcohol use: No    Frequency: Never  . Drug use: No  . Sexual activity: Not on file  Lifestyle  . Physical activity    Days per week: Not on file    Minutes per session: Not on file  . Stress: Not on file  Relationships  . Social Herbalist on phone: Not on file    Gets together: Not on file    Attends religious service: Not on file    Active member of club or organization: Not on file    Attends meetings of clubs or organizations: Not on file    Relationship status: Not on file  . Intimate partner violence    Fear of current or ex partner: Not on file    Emotionally abused: Not on file    Physically abused: Not on file    Forced sexual activity: Not on file  Other Topics Concern  . Not on file  Social History Narrative  . Not on file    Outpatient Medications Prior to Visit  Medication Sig  Dispense Refill  . hydrochlorothiazide (HYDRODIURIL) 12.5 MG tablet Take 1 tablet (12.5 mg total) by mouth daily. (Patient not taking: Reported on 12/01/2018) 30 tablet 2  . lisinopril (PRINIVIL,ZESTRIL) 10 MG tablet Take 1 tablet (10 mg total) by mouth daily. (Patient not taking: Reported on 12/01/2018) 30 tablet 2  . phentermine 37.5 MG capsule Take 1 capsule (37.5 mg total) by mouth every morning for 30 days. 30 capsule 1  . rivaroxaban (XARELTO) 20 MG TABS tablet Take 1 tablet (20 mg total) by mouth daily with supper. 90 tablet 1   No facility-administered medications prior to visit.     No Known Allergies  ROS Review of Systems  All other systems reviewed and are negative.     Objective:    Physical Exam  Constitutional: She is oriented to person, place, and time. She appears well-developed and well-nourished.  HENT:  Head: Atraumatic.  Neck: Neck supple.  Pulmonary/Chest: Breath sounds normal.  Abdominal: Bowel sounds are normal.  Musculoskeletal: Normal range of motion.  Neurological: She is oriented to person, place, and time.  Skin: Skin is warm and dry.  Psychiatric: She has a normal mood and affect.    BP 126/80 (BP Location: Right Arm, Patient Position: Sitting, Cuff Size: Large)   Pulse 80   Temp 98.5 F (36.9 C) (Oral)   Wt 240 lb 3.2 oz (109 kg)   LMP 01/05/2017 (LMP Unknown)   SpO2 96%   BMI 50.20 kg/m  Wt Readings from Last 3 Encounters:  12/01/18 240 lb 3.2 oz (109 kg)  08/10/18 242 lb 12.8 oz (110.1 kg)  06/17/18 242 lb 6.4 oz (110 kg)     There are no preventive care reminders to display for this patient.  There are no preventive care reminders to display for this patient.  No results found for: TSH Lab Results  Component Value Date   WBC 5.6 10/20/2017   HGB 10.8 (L) 10/20/2017   HCT 34.5 10/20/2017   MCV 79 10/20/2017   PLT 308 10/20/2017   Lab Results  Component Value Date   NA 139 09/16/2017   K 3.6 09/16/2017   CO2 24 09/16/2017    GLUCOSE 99 09/16/2017   BUN 5 (L) 09/16/2017   CREATININE 0.65 09/16/2017   CALCIUM 8.4 (L) 09/16/2017   ANIONGAP 9 09/16/2017   No results found for: CHOL No results found for: HDL No results found for: LDLCALC No results found for: TRIG No results found for: CHOLHDL No results found for: [id number]    Assessment & Plan:  Isabel Memosdna was seen today for weight loss.  Diagnoses and all orders for this visit:  Morbid obesity (HCC) BMI 50 currently exercising and will refill phentermine monitor closely.  Advised if any palpitations to stop medication immediately and make me aware.  History of pulmonary embolus (PE) This was a single event secondary to pregnancy was placed on Xarelto and no longer on medication.  Recommended trying to avoid stasis i.e. sitting in the backseat, long air flights, and inability to move around for long periods of time.  Start 81 mg baby aspirin daily.  No history of PEs or DVTs and family.   Essential hypertension Patient has stopped blood pressure which is unremarkable at this time this is contributed to diet modification and exercise continue to monitor blood pressure and heart rate.  Other orders -     phentermine 37.5 MG capsule; Take 1 capsule (37.5 mg total) by mouth every morning for 30 days.   Problem List Items Addressed This Visit    None      No orders of the defined types were placed in this encounter.   Follow-up: No follow-ups on file.    Grayce SessionsMichelle P Edwards, NP

## 2018-12-03 DIAGNOSIS — I10 Essential (primary) hypertension: Secondary | ICD-10-CM | POA: Insufficient documentation

## 2018-12-03 DIAGNOSIS — Z86711 Personal history of pulmonary embolism: Secondary | ICD-10-CM | POA: Insufficient documentation

## 2018-12-16 ENCOUNTER — Telehealth (INDEPENDENT_AMBULATORY_CARE_PROVIDER_SITE_OTHER): Payer: Self-pay

## 2018-12-16 NOTE — Telephone Encounter (Signed)
Patient informed. Tempestt S Roberts, CMA  

## 2018-12-16 NOTE — Telephone Encounter (Signed)
THERE IS NO CONTRAINDICATION FOR HER NOT TO DONATE

## 2018-12-16 NOTE — Telephone Encounter (Signed)
Patient called stating she has been off of blood thinners for about 2 months. She would like to know if it is ok for her to donate plasma. Please advise.Nat Christen, CMA

## 2019-03-21 ENCOUNTER — Ambulatory Visit (INDEPENDENT_AMBULATORY_CARE_PROVIDER_SITE_OTHER): Payer: Medicaid Other | Admitting: Primary Care

## 2019-04-20 ENCOUNTER — Other Ambulatory Visit: Payer: Self-pay

## 2019-04-20 DIAGNOSIS — Z20822 Contact with and (suspected) exposure to covid-19: Secondary | ICD-10-CM

## 2019-04-21 LAB — NOVEL CORONAVIRUS, NAA: SARS-CoV-2, NAA: NOT DETECTED

## 2019-04-25 IMAGING — CT CT ANGIO CHEST
3 of 7 series · 18 of 36 positions shown · IV contrast (APPLIED)
Comparison: None.

CLINICAL DATA: Right-sided chest pain with shortness of breath

EXAM:
CT ANGIOGRAPHY CHEST WITH CONTRAST
TECHNIQUE: Multidetector CT imaging of the chest was performed using the
standard protocol during bolus administration of intravenous
contrast. Multiplanar CT image reconstructions and MIPs were
obtained to evaluate the vascular anatomy.
CONTRAST:  100mL FG6XP4-20B IOPAMIDOL (FG6XP4-20B) INJECTION 76%

[Series 7: lung · axial · 0.74mm/px · z∈[+1240,+1342]mm · 3 of 129 slices shown]
[im 26/129  mediastinal]
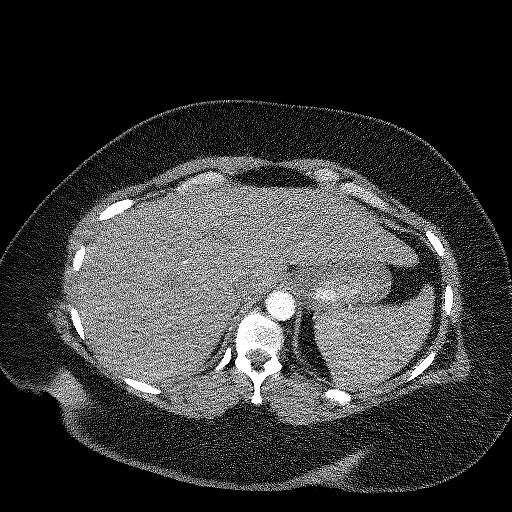
[im 52/129  mediastinal]
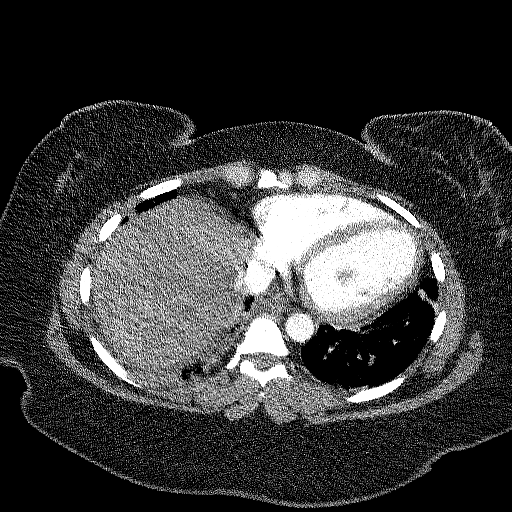
[im 77/129  mediastinal]
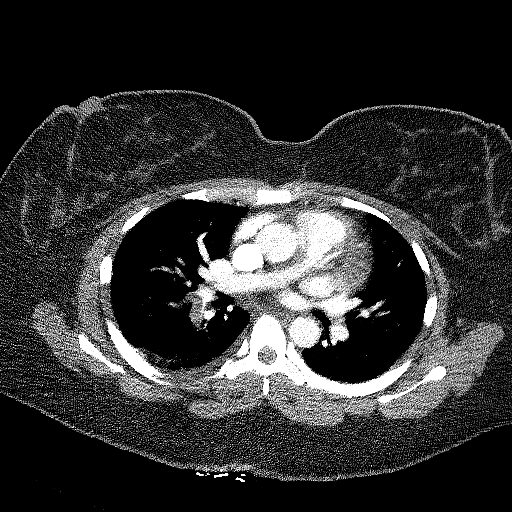

[Series 8: thins · axial · 0.74mm/px · z∈[+1206,+1428]mm · 14 of 368 slices shown]
[im 25/368  lung]
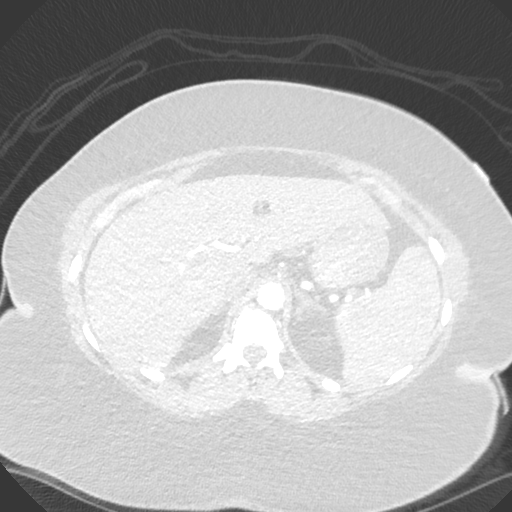
[im 49/368  mediastinal]
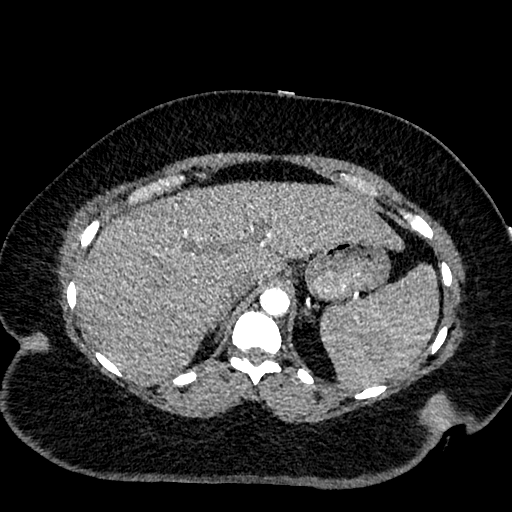
[im 74/368  lung]
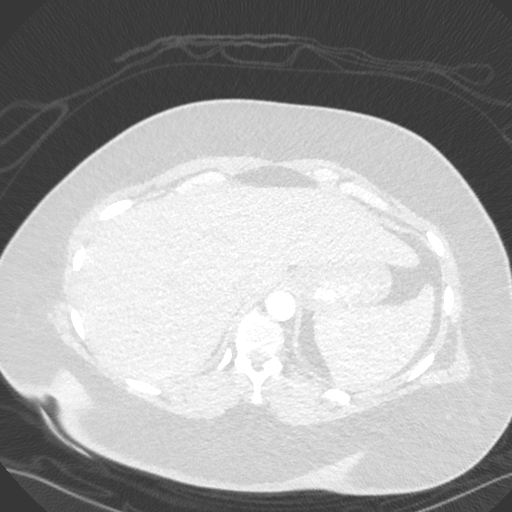
[im 98/368  mediastinal]
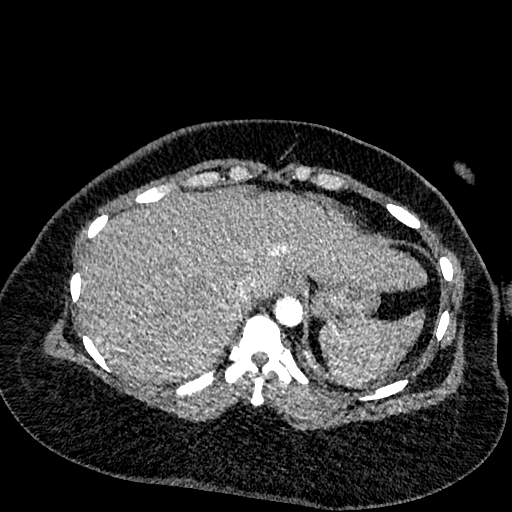
[im 123/368  lung]
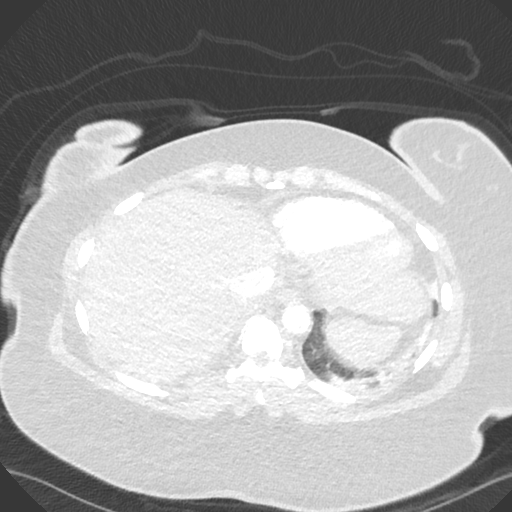
[im 147/368  mediastinal]
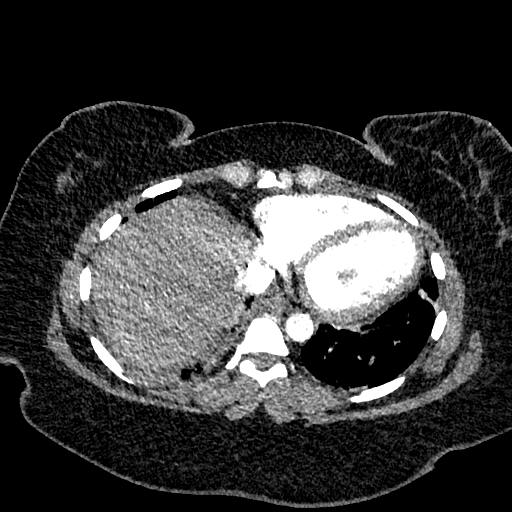
[im 172/368  lung]
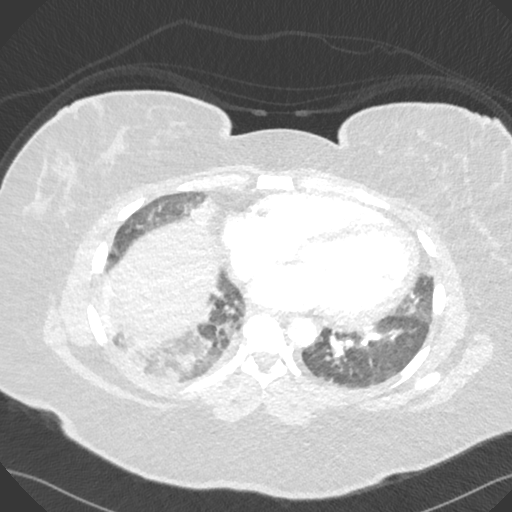
[im 196/368  mediastinal]
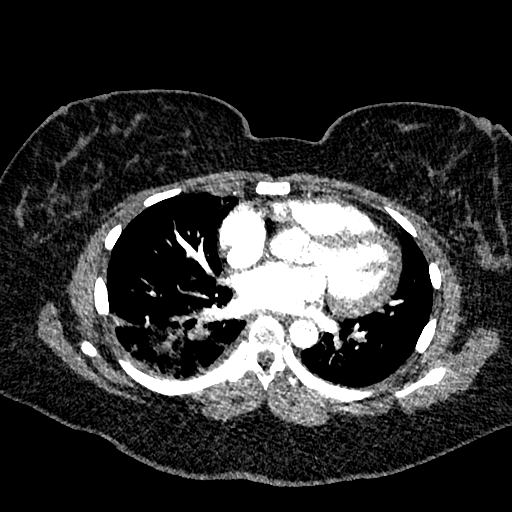
[im 221/368  lung]
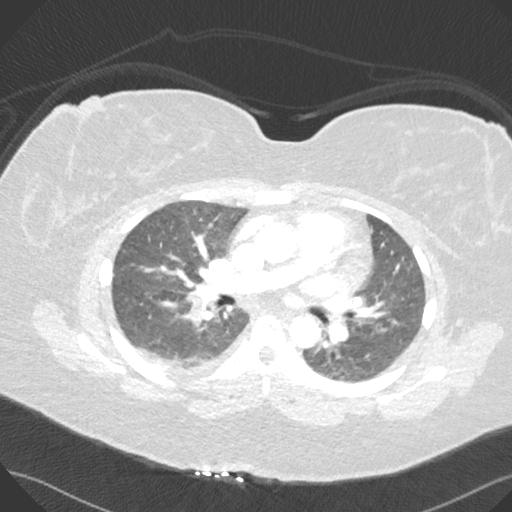
[im 245/368  mediastinal]
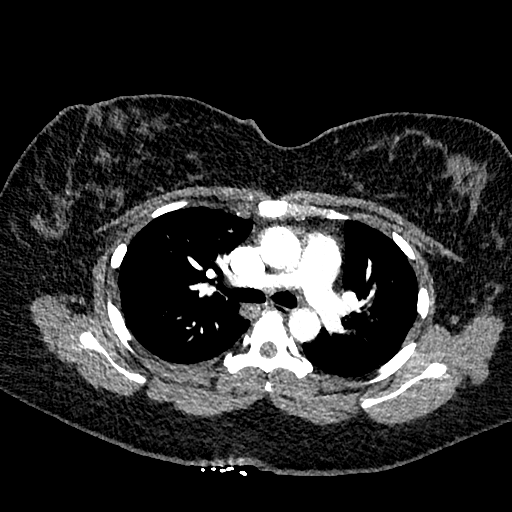
[im 270/368  lung]
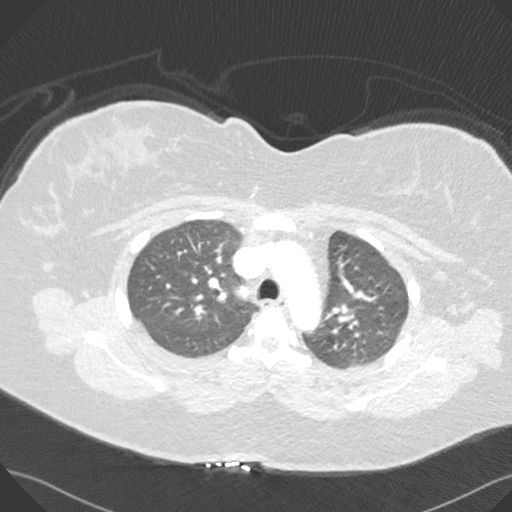
[im 294/368  mediastinal]
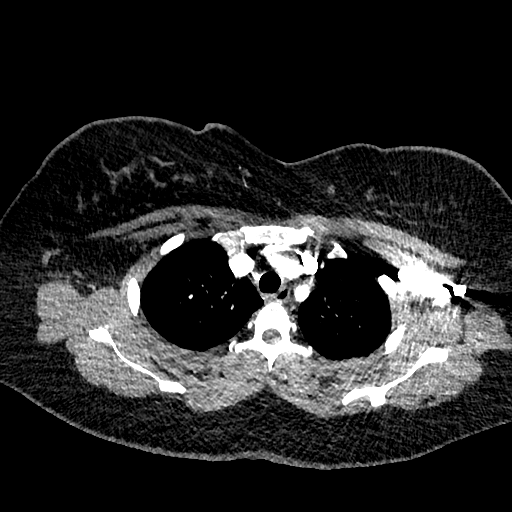
[im 319/368  lung]
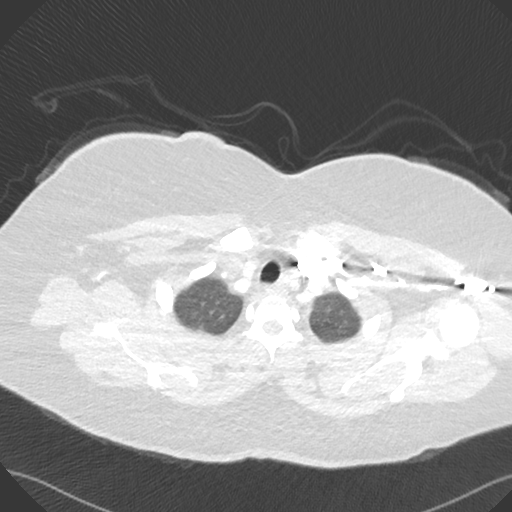
[im 343/368  mediastinal]
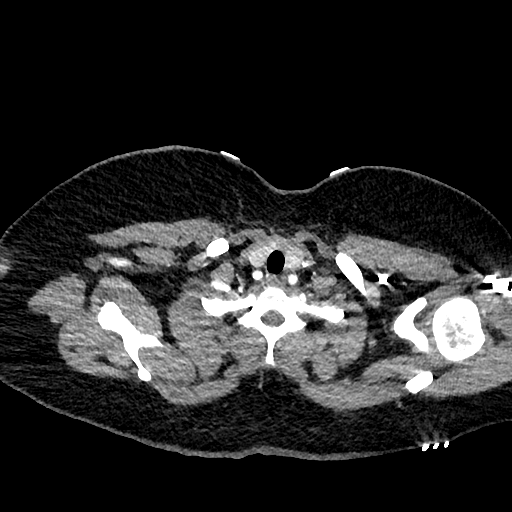

[Series 9: cor · coronal · 0.52mm/px · 1 of 153 slices shown]
[im 77/153  mediastinal]
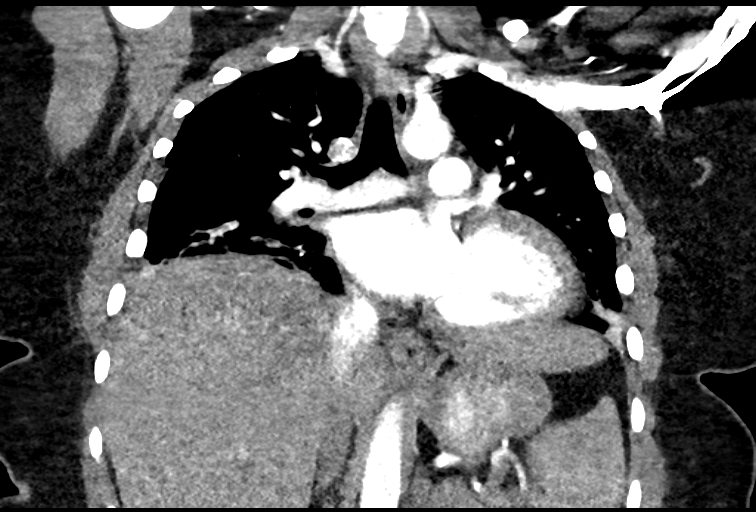

[18 of 36 positions shown; findings below may reference images not displayed]

FINDINGS: Cardiovascular: Satisfactory opacification of the pulmonary arteries
to the segmental level. Filling defect within the right inter lobar
pulmonary artery and multiple right lower lobe segmental and
subsegmental branches. Additional smaller emboli within left lower
lobe segmental and subsegmental branches. RV LV ratio is normal.
There is mild cardiomegaly. Nonaneurysmal aorta. No significant
pericardial effusion.

Mediastinum/Nodes: No enlarged mediastinal, hilar, or axillary lymph
nodes. Thyroid gland, trachea, and esophagus demonstrate no
significant findings.

Lungs/Pleura: Trace right pleural effusion. Ground-glass opacity and
mild consolidation in the right greater than left lower lobe. No
pneumothorax

Upper Abdomen: No acute abnormality.

Musculoskeletal: No chest wall abnormality. No acute or significant
osseous findings.

Review of the MIP images confirms the above findings.
IMPRESSION: 1. Positive for acute pulmonary embolus involving right inter lobar
pulmonary artery and multiple segmental and subsegmental branches of
the right lower lobe with additional small emboli within left lower
lobe segmental and subsegmental branches. No evidence for right
heart strain.
2. Small right pleural effusion. Ground-glass density and mild
consolidations in the right greater than left lower lobe may reflect
pneumonia or possible pulmonary infarction.

Critical Value/emergent results were called by telephone at the time
of interpretation on 09/15/2017 at [DATE] to Dr. ALEX STEPHANE LUCIE , who
verbally acknowledged these results.

## 2019-05-20 ENCOUNTER — Other Ambulatory Visit: Payer: Self-pay

## 2019-05-20 DIAGNOSIS — Z20822 Contact with and (suspected) exposure to covid-19: Secondary | ICD-10-CM

## 2019-05-22 LAB — NOVEL CORONAVIRUS, NAA: SARS-CoV-2, NAA: NOT DETECTED

## 2019-05-26 ENCOUNTER — Ambulatory Visit (INDEPENDENT_AMBULATORY_CARE_PROVIDER_SITE_OTHER): Payer: Medicaid Other | Admitting: Primary Care

## 2019-07-12 ENCOUNTER — Encounter (INDEPENDENT_AMBULATORY_CARE_PROVIDER_SITE_OTHER): Payer: Self-pay | Admitting: Primary Care

## 2019-07-12 ENCOUNTER — Ambulatory Visit (INDEPENDENT_AMBULATORY_CARE_PROVIDER_SITE_OTHER): Payer: Medicaid Other | Admitting: Primary Care

## 2019-07-12 ENCOUNTER — Other Ambulatory Visit: Payer: Self-pay

## 2019-07-12 VITALS — BP 132/88 | HR 66 | Temp 97.3°F | Ht <= 58 in | Wt 239.6 lb

## 2019-07-12 DIAGNOSIS — I10 Essential (primary) hypertension: Secondary | ICD-10-CM

## 2019-07-12 DIAGNOSIS — O24419 Gestational diabetes mellitus in pregnancy, unspecified control: Secondary | ICD-10-CM

## 2019-07-12 DIAGNOSIS — Z23 Encounter for immunization: Secondary | ICD-10-CM

## 2019-07-12 DIAGNOSIS — Z111 Encounter for screening for respiratory tuberculosis: Secondary | ICD-10-CM | POA: Diagnosis not present

## 2019-07-12 DIAGNOSIS — Z6841 Body Mass Index (BMI) 40.0 and over, adult: Secondary | ICD-10-CM

## 2019-07-12 LAB — POCT GLYCOSYLATED HEMOGLOBIN (HGB A1C): Hemoglobin A1C: 5.9 % — AB (ref 4.0–5.6)

## 2019-07-12 NOTE — Patient Instructions (Addendum)
Health Maintenance, Female Adopting a healthy lifestyle and getting preventive care are important in promoting health and wellness. Ask your health care provider about:  The right schedule for you to have regular tests and exams.  Things you can do on your own to prevent diseases and keep yourself healthy. What should I know about diet, weight, and exercise? Eat a healthy diet   Eat a diet that includes plenty of vegetables, fruits, low-fat dairy products, and lean protein.  Do not eat a lot of foods that are high in solid fats, added sugars, or sodium. Maintain a healthy weight Body mass index (BMI) is used to identify weight problems. It estimates body fat based on height and weight. Your health care provider can help determine your BMI and help you achieve or maintain a healthy weight. Get regular exercise Get regular exercise. This is one of the most important things you can do for your health. Most adults should:  Exercise for at least 150 minutes each week. The exercise should increase your heart rate and make you sweat (moderate-intensity exercise).  Do strengthening exercises at least twice a week. This is in addition to the moderate-intensity exercise.  Spend less time sitting. Even light physical activity can be beneficial. Watch cholesterol and blood lipids Have your blood tested for lipids and cholesterol at 37 years of age, then have this test every 5 years. Have your cholesterol levels checked more often if:  Your lipid or cholesterol levels are high.  You are older than 37 years of age.  You are at high risk for heart disease. What should I know about cancer screening? Depending on your health history and family history, you may need to have cancer screening at various ages. This may include screening for:  Breast cancer.  Cervical cancer.  Colorectal cancer.  Skin cancer.  Lung cancer. What should I know about heart disease, diabetes, and high blood  pressure? Blood pressure and heart disease  High blood pressure causes heart disease and increases the risk of stroke. This is more likely to develop in people who have high blood pressure readings, are of African descent, or are overweight.  Have your blood pressure checked: ? Every 3-5 years if you are 18-39 years of age. ? Every year if you are 40 years old or older. Diabetes Have regular diabetes screenings. This checks your fasting blood sugar level. Have the screening done:  Once every three years after age 40 if you are at a normal weight and have a low risk for diabetes.  More often and at a younger age if you are overweight or have a high risk for diabetes. What should I know about preventing infection? Hepatitis B If you have a higher risk for hepatitis B, you should be screened for this virus. Talk with your health care provider to find out if you are at risk for hepatitis B infection. Hepatitis C Testing is recommended for:  Everyone born from 1945 through 1965.  Anyone with known risk factors for hepatitis C. Sexually transmitted infections (STIs)  Get screened for STIs, including gonorrhea and chlamydia, if: ? You are sexually active and are younger than 37 years of age. ? You are older than 37 years of age and your health care provider tells you that you are at risk for this type of infection. ? Your sexual activity has changed since you were last screened, and you are at increased risk for chlamydia or gonorrhea. Ask your health care provider if   you are at risk.  Ask your health care provider about whether you are at high risk for HIV. Your health care provider may recommend a prescription medicine to help prevent HIV infection. If you choose to take medicine to prevent HIV, you should first get tested for HIV. You should then be tested every 3 months for as long as you are taking the medicine. Pregnancy  If you are about to stop having your period (premenopausal) and  you may become pregnant, seek counseling before you get pregnant.  Take 400 to 800 micrograms (mcg) of folic acid every day if you become pregnant.  Ask for birth control (contraception) if you want to prevent pregnancy. Osteoporosis and menopause Osteoporosis is a disease in which the bones lose minerals and strength with aging. This can result in bone fractures. If you are 71 years old or older, or if you are at risk for osteoporosis and fractures, ask your health care provider if you should:  Be screened for bone loss.  Take a calcium or vitamin D supplement to lower your risk of fractures.  Be given hormone replacement therapy (HRT) to treat symptoms of menopause. Follow these instructions at home: Lifestyle  Do not use any products that contain nicotine or tobacco, such as cigarettes, e-cigarettes, and chewing tobacco. If you need help quitting, ask your health care provider.  Do not use street drugs.  Do not share needles.  Ask your health care provider for help if you need support or information about quitting drugs. Alcohol use  Do not drink alcohol if: ? Your health care provider tells you not to drink. ? You are pregnant, may be pregnant, or are planning to become pregnant.  If you drink alcohol: ? Limit how much you use to 0-1 drink a day. ? Limit intake if you are breastfeeding.  Be aware of how much alcohol is in your drink. In the [place] one drink equals one 12 oz bottle of beer (355 mL), one 5 oz glass of wine (148 mL), or one 1 oz glass of hard liquor (44 mL). General instructions  Schedule regular health, dental, and eye exams.  Stay current with your vaccines.  Tell your health care provider if: ? You often feel depressed. ? You have ever been abused or do not feel safe at home. Summary  Adopting a healthy lifestyle and getting preventive care are important in promoting health and wellness.  Follow your health care provider's instructions about healthy  diet, exercising, and getting tested or screened for diseases.  Follow your health care provider's instructions on monitoring your cholesterol and blood pressure. This information is not intended to replace advice given to you by your health care provider. Make sure you discuss any questions you have with your health care provider. Document Revised: 05/26/2018 Document Reviewed: 05/26/2018 Elsevier Patient Education  2020 [employer].  Prediabetes Prediabetes is the condition of having a blood sugar (blood glucose) level that is higher than it should be, but not high enough for you to be diagnosed with type 2 diabetes. Having prediabetes puts you at risk for developing type 2 diabetes (type 2 diabetes mellitus). Prediabetes may be called impaired glucose tolerance or impaired fasting glucose. Prediabetes usually does not cause symptoms. Your health care provider can diagnose this condition with blood tests. You may be tested for prediabetes if you are overweight and if you have at least one other risk factor for prediabetes. What is blood glucose, and how is it measured? Blood  glucose refers to the amount of glucose in your bloodstream. Glucose comes from eating foods that contain sugars and starches (carbohydrates), which the body breaks down into glucose. Your blood glucose level may be measured in mg/dL (milligrams per deciliter) or mmol/L (millimoles per liter). Your blood glucose may be checked with one or more of the following blood tests:  A fasting blood glucose (FBG) test. You will not be allowed to eat (you will fast) for 8 hours or longer before a blood sample is taken. ? A normal range for FBG is 70-100 mg/dl (1.6-1.0 mmol/L).  An A1c (hemoglobin A1c) blood test. This test provides information about blood glucose control over the previous 2?54months.  An oral glucose tolerance test (OGTT). This test measures your blood glucose at two times: ? After fasting. This is your baseline  level. ? Two hours after you drink a beverage that contains glucose. You may be diagnosed with prediabetes:  If your FBG is 100?125 mg/dL (9.6-0.4 mmol/L).  If your A1c level is 5.7?6.4%.  If your OGTT result is 140?199 mg/dL (5.4-09 mmol/L). These blood tests may be repeated to confirm your diagnosis. How can this condition affect me? The pancreas produces a hormone (insulin) that helps to move glucose from the bloodstream into cells. When cells in the body do not respond properly to insulin that the body makes (insulin resistance), excess glucose builds up in the blood instead of going into cells. As a result, high blood glucose (hyperglycemia) can develop, which can cause many complications. Hyperglycemia is a symptom of prediabetes. Having high blood glucose for a long time is dangerous. Too much glucose in your blood can damage your nerves and blood vessels. Long-term damage can lead to complications from diabetes, which may include:  Heart disease.  Stroke.  Blindness.  Kidney disease.  Depression.  Poor circulation in the feet and legs, which could lead to surgical removal (amputation) in severe cases. What can increase my risk? Risk factors for prediabetes include:  Having a family member with type 2 diabetes.  Being overweight or obese.  Being older than age 73.  Being of American [place], African-American, Hispanic/Latino, or Asian/Pacific Islander descent.  Having an inactive (sedentary) lifestyle.  Having a history of heart disease.  History of gestational diabetes or polycystic ovary syndrome (PCOS), in women.  Having low levels of good cholesterol (HDL-C) or high levels of blood fats (triglycerides).  Having high blood pressure. What actions can I take to prevent diabetes?      Be physically active. ? Do moderate-intensity physical activity for 30 or more minutes on 5 or more days of the week, or as much as told by your health care provider. This could  be brisk walking, biking, or water aerobics. ? Ask your health care provider what activities are safe for you. A mix of physical activities may be best, such as walking, swimming, cycling, and strength training.  Lose weight as told by your health care provider. ? Losing 5-7% of your body weight can reverse insulin resistance. ? Your health care provider can determine how much weight loss is best for you and can help you lose weight safely.  Follow a healthy meal plan. This includes eating lean proteins, complex carbohydrates, fresh fruits and vegetables, low-fat dairy products, and healthy fats. ? Follow instructions from your health care provider about eating or drinking restrictions. ? Make an appointment to see a diet and nutrition specialist (registered dietitian) to help you create a healthy eating plan that  is right for you.  Do not smoke or use any tobacco products, such as cigarettes, chewing tobacco, and e-cigarettes. If you need help quitting, ask your health care provider.  Take over-the-counter and prescription medicines as told by your health care provider. You may be prescribed medicines that help lower the risk of type 2 diabetes.  Keep all follow-up visits as told by your health care provider. This is important. Summary  Prediabetes is the condition of having a blood sugar (blood glucose) level that is higher than it should be, but not high enough for you to be diagnosed with type 2 diabetes.  Having prediabetes puts you at risk for developing type 2 diabetes (type 2 diabetes mellitus).  To help prevent type 2 diabetes, make lifestyle changes such as being physically active and eating a healthy diet. Lose weight as told by your health care provider. This information is not intended to replace advice given to you by your health care provider. Make sure you discuss any questions you have with your health care provider. Document Revised: 09/24/2018 Document Reviewed:  07/24/2015 Elsevier Patient Education  [place].

## 2019-07-12 NOTE — Progress Notes (Signed)
Established Patient Office Visit  Subjective:  Patient ID: Isabel Lewis, female    DOB: 11-05-1980  Age: 39 y.o. MRN: [id number]  CC:  Chief Complaint  Patient presents with  . Annual Exam  . PPD Placement    HPI Isabel Lewis presents for annual physical and PPD for work . Only concern is weight. Discussed healthcare gaps and she will schedule a pap for follow up  History reviewed. No pertinent past medical history.  Past Surgical History:  Procedure Laterality Date  . CESAREAN SECTION     x5    History reviewed. No pertinent family history.  Social History   Socioeconomic History  . Marital status: Soil scientist    Spouse name: Not on file  . Number of children: Not on file  . Years of education: Not on file  . Highest education level: Not on file  Occupational History  . Not on file  Tobacco Use  . Smoking status: Never Smoker  . Smokeless tobacco: Never Used  Substance and Sexual Activity  . Alcohol use: No  . Drug use: No  . Sexual activity: Not on file  Other Topics Concern  . Not on file  Social History Narrative  . Not on file   Social Determinants of Health   Financial Resource Strain:   . Difficulty of Paying Living Expenses: Not on file  Food Insecurity:   . Worried About Charity fundraiser in the Last Year: Not on file  . Ran Out of Food in the Last Year: Not on file  Transportation Needs:   . Lack of Transportation (Medical): Not on file  . Lack of Transportation (Non-Medical): Not on file  Physical Activity:   . Days of Exercise per Week: Not on file  . Minutes of Exercise per Session: Not on file  Stress:   . Feeling of Stress : Not on file  Social Connections:   . Frequency of Communication with Friends and Family: Not on file  . Frequency of Social Gatherings with Friends and Family: Not on file  . Attends Religious Services: Not on file  . Active Member of Clubs or Organizations: Not on file  . Attends Archivist  Meetings: Not on file  . Marital Status: Not on file  Intimate Partner Violence:   . Fear of Current or Ex-Partner: Not on file  . Emotionally Abused: Not on file  . Physically Abused: Not on file  . Sexually Abused: Not on file    Outpatient Medications Prior to Visit  Medication Sig Dispense Refill  . hydrochlorothiazide (HYDRODIURIL) 12.5 MG tablet Take 1 tablet (12.5 mg total) by mouth daily. (Patient not taking: Reported on 12/01/2018) 30 tablet 2  . lisinopril (PRINIVIL,ZESTRIL) 10 MG tablet Take 1 tablet (10 mg total) by mouth daily. (Patient not taking: Reported on 12/01/2018) 30 tablet 2  . phentermine 37.5 MG capsule Take 1 capsule (37.5 mg total) by mouth every morning for 30 days. 30 capsule 1  . rivaroxaban (XARELTO) 20 MG TABS tablet Take 1 tablet (20 mg total) by mouth daily with supper. 90 tablet 1   No facility-administered medications prior to visit.    No Known Allergies  ROS Review of Systems  All other systems reviewed and are negative.     Objective:    Physical Exam  Constitutional: She is oriented to person, place, and time. She appears well-developed.  Morbid obesity  HENT:  Head: Normocephalic.  Eyes: Pupils are equal, round, and  reactive to light. EOM are normal.  Cardiovascular: Normal rate and regular rhythm.  Pulmonary/Chest: Effort normal and breath sounds normal.  Abdominal: She exhibits distension.  Musculoskeletal:        General: Normal range of motion.     Cervical back: Normal range of motion and neck supple.  Neurological: She is oriented to person, place, and time. She has normal reflexes.  Skin: Skin is warm and dry.  Psychiatric: She has a normal mood and affect. Her behavior is normal. Thought content normal.    BP 132/88 (BP Location: Right Arm, Patient Position: Sitting, Cuff Size: Large)   Pulse 66   Temp (!) 97.3 F (36.3 C) (Temporal)   Ht 4' 10" (1.473 m)   Wt 239 lb 9.6 oz (108.7 kg)   LMP 01/05/2017 (LMP Unknown)    SpO2 96%   BMI 50.08 kg/m  Wt Readings from Last 3 Encounters:  07/15/19 237 lb (107.5 kg)  07/12/19 239 lb 9.6 oz (108.7 kg)  12/01/18 240 lb 3.2 oz (109 kg)     Health Maintenance Due  Topic Date Due  . PAP SMEAR-Modifier  07/05/2003    There are no preventive care reminders to display for this patient.  Lab Results  Component Value Date   TSH 0.395 (L) 07/12/2019   Lab Results  Component Value Date   WBC 5.2 07/12/2019   HGB 14.4 07/12/2019   HCT 43.3 07/12/2019   MCV 84 07/12/2019   PLT 275 07/12/2019   Lab Results  Component Value Date   NA 140 07/12/2019   K 4.1 07/12/2019   CO2 26 07/12/2019   GLUCOSE 86 07/12/2019   BUN 12 07/12/2019   CREATININE 0.65 07/12/2019   BILITOT 0.3 07/12/2019   ALKPHOS 94 07/12/2019   AST 16 07/12/2019   ALT 13 07/12/2019   PROT 7.2 07/12/2019   ALBUMIN 4.4 07/12/2019   CALCIUM 9.6 07/12/2019   ANIONGAP 9 09/16/2017   Lab Results  Component Value Date   CHOL 195 07/12/2019   Lab Results  Component Value Date   HDL 57 07/12/2019   Lab Results  Component Value Date   LDLCALC 123 (H) 07/12/2019   Lab Results  Component Value Date   TRIG 80 07/12/2019   Lab Results  Component Value Date   CHOLHDL 3.4 07/12/2019   Lab Results  Component Value Date   HGBA1C 5.9 (A) 07/12/2019      Assessment & Plan:  Isabel Lewis was seen today for annual exam and ppd placement.  Diagnoses and all orders for this visit:  Gestational diabetes mellitus (GDM), antepartum, gestational diabetes method of control unspecified -     CBC with Differential -     HgB A1c 5.9 New diagnosis Prediabetes   Morbid obesity ([place]) Discussed diet and exercise for person with BMI >25. Instructed: You must burn more calories than you eat. Losing 5 percent of your body weight should be considered a success. In the longer term, losing more than 15 percent of your body weight and staying at this weight is an extremely good result. However, keep in mind  that even losing 5 percent of your body weight leads to important health benefits, so try not to get discouraged if you're not able to lose more than this. Will recheck weight in 3-6 months. -     TSH + free T4 -     Lipid Panel  Essential hypertension Blood pressure goal of less than 130/80, low-sodium, DASH diet, medication  compliance, 150 minutes of moderate intensity exercise per week. Discussed medication compliance, adverse effects. -     CBC with Differential -     CMP14+EGFR; Future   No orders of the defined types were placed in this encounter.   Follow-up: Return for schedule pap.    Kerin Perna, NP

## 2019-07-13 LAB — CMP14+EGFR
ALT: 13 IU/L (ref 0–32)
AST: 16 IU/L (ref 0–40)
Albumin/Globulin Ratio: 1.6 (ref 1.2–2.2)
Albumin: 4.4 g/dL (ref 3.8–4.8)
Alkaline Phosphatase: 94 IU/L (ref 39–117)
BUN/Creatinine Ratio: 18 (ref 9–23)
BUN: 12 mg/dL (ref 6–20)
Bilirubin Total: 0.3 mg/dL (ref 0.0–1.2)
CO2: 26 mmol/L (ref 20–29)
Calcium: 9.6 mg/dL (ref 8.7–10.2)
Chloride: 102 mmol/L (ref 96–106)
Creatinine, Ser: 0.65 mg/dL (ref 0.57–1.00)
GFR calc Af Amer: 131 mL/min/{1.73_m2} (ref >59)
GFR calc non Af Amer: 114 mL/min/{1.73_m2} (ref >59)
Globulin, Total: 2.8 g/dL (ref 1.5–4.5)
Glucose: 86 mg/dL (ref 65–99)
Potassium: 4.1 mmol/L (ref 3.5–5.2)
Sodium: 140 mmol/L (ref 134–144)
Total Protein: 7.2 g/dL (ref 6.0–8.5)

## 2019-07-13 LAB — CBC WITH DIFFERENTIAL/PLATELET
Basophils Absolute: 0.1 10*3/uL (ref 0.0–0.2)
Basos: 1 %
EOS (ABSOLUTE): 0.2 10*3/uL (ref 0.0–0.4)
Eos: 3 %
Hematocrit: 43.3 % (ref 34.0–46.6)
Hemoglobin: 14.4 g/dL (ref 11.1–15.9)
Immature Grans (Abs): 0 10*3/uL (ref 0.0–0.1)
Immature Granulocytes: 0 %
Lymphocytes Absolute: 2.3 10*3/uL (ref 0.7–3.1)
Lymphs: 45 %
MCH: 28 pg (ref 26.6–33.0)
MCHC: 33.3 g/dL (ref 31.5–35.7)
MCV: 84 fL (ref 79–97)
Monocytes Absolute: 0.3 10*3/uL (ref 0.1–0.9)
Monocytes: 5 %
Neutrophils Absolute: 2.4 10*3/uL (ref 1.4–7.0)
Neutrophils: 46 %
Platelets: 275 10*3/uL (ref 150–450)
RBC: 5.15 x10E6/uL (ref 3.77–5.28)
RDW: 13.1 % (ref 11.7–15.4)
WBC: 5.2 10*3/uL (ref 3.4–10.8)

## 2019-07-13 LAB — LIPID PANEL
Chol/HDL Ratio: 3.4 ratio (ref 0.0–4.4)
Cholesterol, Total: 195 mg/dL (ref 100–199)
HDL: 57 mg/dL (ref >39)
LDL Chol Calc (NIH): 123 mg/dL — ABNORMAL HIGH (ref 0–99)
Triglycerides: 80 mg/dL (ref 0–149)
VLDL Cholesterol Cal: 15 mg/dL (ref 5–40)

## 2019-07-13 LAB — TSH+FREE T4
Free T4: 1.27 ng/dL (ref 0.82–1.77)
TSH: 0.395 u[IU]/mL — ABNORMAL LOW (ref 0.450–4.500)

## 2019-07-15 ENCOUNTER — Other Ambulatory Visit: Payer: Self-pay

## 2019-07-15 ENCOUNTER — Encounter (INDEPENDENT_AMBULATORY_CARE_PROVIDER_SITE_OTHER): Payer: Self-pay | Admitting: Primary Care

## 2019-07-15 ENCOUNTER — Other Ambulatory Visit (HOSPITAL_COMMUNITY)
Admission: RE | Admit: 2019-07-15 | Discharge: 2019-07-15 | Disposition: A | Discharge status: Home / Self Care | Payer: Medicaid Other | Source: Ambulatory Visit | Attending: Primary Care | Admitting: Primary Care

## 2019-07-15 ENCOUNTER — Ambulatory Visit (INDEPENDENT_AMBULATORY_CARE_PROVIDER_SITE_OTHER): Payer: Medicaid Other | Admitting: Primary Care

## 2019-07-15 VITALS — BP 126/85 | HR 77 | Temp 96.8°F | Ht <= 58 in | Wt 237.0 lb

## 2019-07-15 DIAGNOSIS — Z0000 Encounter for general adult medical examination without abnormal findings: Secondary | ICD-10-CM | POA: Diagnosis not present

## 2019-07-15 DIAGNOSIS — Z6841 Body Mass Index (BMI) 40.0 and over, adult: Secondary | ICD-10-CM

## 2019-07-15 DIAGNOSIS — N898 Other specified noninflammatory disorders of vagina: Secondary | ICD-10-CM

## 2019-07-15 DIAGNOSIS — Z124 Encounter for screening for malignant neoplasm of cervix: Secondary | ICD-10-CM | POA: Diagnosis not present

## 2019-07-15 LAB — TB SKIN TEST
Induration: 0 mm
TB Skin Test: NEGATIVE

## 2019-07-15 MED ORDER — PHENTERMINE HCL 37.5 MG PO CAPS: 37.5000 mg | ORAL_CAPSULE | ORAL | 1 refills | Status: AC

## 2019-07-15 NOTE — Progress Notes (Signed)
Established Patient Office Visit  Subjective:  Patient ID: Isabel Lewis, female    DOB: Oct 18, 1980  Age: 39 y.o. MRN: [id number]  CC:  Chief Complaint  Patient presents with  . Gynecologic Exam    HPI Isabel Lewis presents for a well women visit. She is also concern about her weight and would like to start back on taking her medication for appetite suppression.   History reviewed. No pertinent past medical history.  Past Surgical History:  Procedure Laterality Date  . CESAREAN SECTION     x5    History reviewed. No pertinent family history.  Social History   Socioeconomic History  . Marital status: Media planner    Spouse name: Not on file  . Number of children: Not on file  . Years of education: Not on file  . Highest education level: Not on file  Occupational History  . Not on file  Tobacco Use  . Smoking status: Never Smoker  . Smokeless tobacco: Never Used  Substance and Sexual Activity  . Alcohol use: No  . Drug use: No  . Sexual activity: Not on file  Other Topics Concern  . Not on file  Social History Narrative  . Not on file   Social Determinants of Health   Financial Resource Strain:   . Difficulty of Paying Living Expenses: Not on file  Food Insecurity:   . Worried About Programme researcher, broadcasting/film/video in the Last Year: Not on file  . Ran Out of Food in the Last Year: Not on file  Transportation Needs:   . Lack of Transportation (Medical): Not on file  . Lack of Transportation (Non-Medical): Not on file  Physical Activity:   . Days of Exercise per Week: Not on file  . Minutes of Exercise per Session: Not on file  Stress:   . Feeling of Stress : Not on file  Social Connections:   . Frequency of Communication with Friends and Family: Not on file  . Frequency of Social Gatherings with Friends and Family: Not on file  . Attends Religious Services: Not on file  . Active Member of Clubs or Organizations: Not on file  . Attends Banker  Meetings: Not on file  . Marital Status: Not on file  Intimate Partner Violence:   . Fear of Current or Ex-Partner: Not on file  . Emotionally Abused: Not on file  . Physically Abused: Not on file  . Sexually Abused: Not on file    Outpatient Medications Prior to Visit  Medication Sig Dispense Refill  . hydrochlorothiazide (HYDRODIURIL) 12.5 MG tablet Take 1 tablet (12.5 mg total) by mouth daily. (Patient not taking: Reported on 12/01/2018) 30 tablet 2  . lisinopril (PRINIVIL,ZESTRIL) 10 MG tablet Take 1 tablet (10 mg total) by mouth daily. (Patient not taking: Reported on 12/01/2018) 30 tablet 2  . phentermine 37.5 MG capsule Take 1 capsule (37.5 mg total) by mouth every morning for 30 days. 30 capsule 1  . rivaroxaban (XARELTO) 20 MG TABS tablet Take 1 tablet (20 mg total) by mouth daily with supper. 90 tablet 1   No facility-administered medications prior to visit.    No Known Allergies  ROS Review of Systems  All other systems reviewed and are negative.     Objective:    Physical Exam CONSTITUTIONAL: Well-developed, well-nourished female in no acute distress.  HENT:  Normocephalic, atraumatic, External right and left ear normal. Oropharynx is clear and moist EYES: Conjunctivae and EOM are normal.  Pupils are equal, round, and reactive to light. No scleral icterus.  NECK: Normal range of motion, supple, no masses.  Normal thyroid.  SKIN: Skin is warm and dry. No rash noted. Not diaphoretic. No erythema. No pallor. [place]: Alert and oriented to person, place, and time. Normal reflexes, muscle tone coordination. No cranial nerve deficit noted. PSYCHIATRIC: Normal mood and affect. Normal behavior. Normal judgment and thought content. CARDIOVASCULAR: Normal heart rate noted, regular rhythm RESPIRATORY: Clear to auscultation bilaterally. Effort and breath sounds normal, no problems with respiration noted. BREASTS: Taught SBE. ABDOMEN: Soft, normal bowel sounds, no distention  noted.  No tenderness, rebound or guarding.  PELVIC: Normal appearing external genitalia; normal appearing vaginal mucosa and cervix.  No abnormal discharge noted.  Pap smear obtained.  Normal uterine size, no other palpable masses, no uterine or adnexal tenderness. MUSCULOSKELETAL: Normal range of motion. No tenderness.  No cyanosis, clubbing, or edema.  2+ distal pulses. BP 126/85 (BP Location: Right Arm, Patient Position: Sitting, Cuff Size: Large)   Pulse 77   Temp (!) 96.8 F (36 C) (Temporal)   Ht 4\' 10"  (1.473 m)   Wt 237 lb (107.5 kg)   LMP 01/05/2017 (LMP Unknown)   SpO2 95%   BMI 49.53 kg/m  Wt Readings from Last 3 Encounters:  07/15/19 237 lb (107.5 kg)  07/12/19 239 lb 9.6 oz (108.7 kg)  12/01/18 240 lb 3.2 oz (109 kg)     Health Maintenance Due  Topic Date Due  . PAP SMEAR-Modifier  07/05/2003    There are no preventive care reminders to display for this patient.  Lab Results  Component Value Date   TSH 0.395 (L) 07/12/2019   Lab Results  Component Value Date   WBC 5.2 07/12/2019   HGB 14.4 07/12/2019   HCT 43.3 07/12/2019   MCV 84 07/12/2019   PLT 275 07/12/2019   Lab Results  Component Value Date   NA 140 07/12/2019   K 4.1 07/12/2019   CO2 26 07/12/2019   GLUCOSE 86 07/12/2019   BUN 12 07/12/2019   CREATININE 0.65 07/12/2019   BILITOT 0.3 07/12/2019   ALKPHOS 94 07/12/2019   AST 16 07/12/2019   ALT 13 07/12/2019   PROT 7.2 07/12/2019   ALBUMIN 4.4 07/12/2019   CALCIUM 9.6 07/12/2019   ANIONGAP 9 09/16/2017   Lab Results  Component Value Date   CHOL 195 07/12/2019   Lab Results  Component Value Date   HDL 57 07/12/2019   Lab Results  Component Value Date   LDLCALC 123 (H) 07/12/2019   Lab Results  Component Value Date   TRIG 80 07/12/2019   Lab Results  Component Value Date   CHOLHDL 3.4 07/12/2019   Lab Results  Component Value Date   HGBA1C 5.9 (A) 07/12/2019      Assessment & Plan:  Isabel Lewis was seen today for  gynecologic exam.  Diagnoses and all orders for this visit:  Cervical cancer screening The USPSTF recommendations screening of cervical cancer every 3 years with cervical cytology.  All women age 37 to 61 years are at risk for cervical cancer because of potential exposure to high risk HPV types to sexual intercourse and should be screened.  Certainly risk factors further increased risk for cervical cancer including HIV infection, a compromised immune system, and utero exposure to diethylstilbestrol and previous treatment of high-grade precancerous lesions or cervical cancer.  Women with these risk factors should receive individual follow-up -     Cytology -  PAP  Vaginal discharge -     Cervicovaginal ancillary only  Class 3 severe obesity without serious comorbidity with body mass index (BMI) of 50.0 to 59.9 in adult, unspecified obesity type (HCC) Morbid Obesity is > 40 indicating an excess in caloric intake or underlining conditions. This may lead to other co-morbidities example is recently dx with prediabetes, HTN is controlled with diet and excercise. Stopped taking Bp medication and Bp unremarkable  Lifestyle modifications of diet and exercise may reduce obesity. Will prescribe phentermine 37.5    Meds ordered this encounter  Medications  . phentermine 37.5 MG capsule    Sig: Take 1 capsule (37.5 mg total) by mouth every morning.    Dispense:  30 capsule    Refill:  1    Follow-up: Return in about 4 weeks (around 08/12/2019) for Bp and weight check.  Tele  Grayce Sessions, NP

## 2019-07-15 NOTE — Patient Instructions (Signed)
Health Maintenance, Female Adopting a healthy lifestyle and getting preventive care are important in promoting health and wellness. Ask your health care provider about:  The right schedule for you to have regular tests and exams.  Things you can do on your own to prevent diseases and keep yourself healthy. What should I know about diet, weight, and exercise? Eat a healthy diet   Eat a diet that includes plenty of vegetables, fruits, low-fat dairy products, and lean protein.  Do not eat a lot of foods that are high in solid fats, added sugars, or sodium. Maintain a healthy weight Body mass index (BMI) is used to identify weight problems. It estimates body fat based on height and weight. Your health care provider can help determine your BMI and help you achieve or maintain a healthy weight. Get regular exercise Get regular exercise. This is one of the most important things you can do for your health. Most adults should:  Exercise for at least 150 minutes each week. The exercise should increase your heart rate and make you sweat (moderate-intensity exercise).  Do strengthening exercises at least twice a week. This is in addition to the moderate-intensity exercise.  Spend less time sitting. Even light physical activity can be beneficial. Watch cholesterol and blood lipids Have your blood tested for lipids and cholesterol at 37 years of age, then have this test every 5 years. Have your cholesterol levels checked more often if:  Your lipid or cholesterol levels are high.  You are older than 37 years of age.  You are at high risk for heart disease. What should I know about cancer screening? Depending on your health history and family history, you may need to have cancer screening at various ages. This may include screening for:  Breast cancer.  Cervical cancer.  Colorectal cancer.  Skin cancer.  Lung cancer. What should I know about heart disease, diabetes, and high blood  pressure? Blood pressure and heart disease  High blood pressure causes heart disease and increases the risk of stroke. This is more likely to develop in people who have high blood pressure readings, are of African descent, or are overweight.  Have your blood pressure checked: ? Every 3-5 years if you are 18-39 years of age. ? Every year if you are 40 years old or older. Diabetes Have regular diabetes screenings. This checks your fasting blood sugar level. Have the screening done:  Once every three years after age 40 if you are at a normal weight and have a low risk for diabetes.  More often and at a younger age if you are overweight or have a high risk for diabetes. What should I know about preventing infection? Hepatitis B If you have a higher risk for hepatitis B, you should be screened for this virus. Talk with your health care provider to find out if you are at risk for hepatitis B infection. Hepatitis C Testing is recommended for:  Everyone born from 1945 through 1965.  Anyone with known risk factors for hepatitis C. Sexually transmitted infections (STIs)  Get screened for STIs, including gonorrhea and chlamydia, if: ? You are sexually active and are younger than 37 years of age. ? You are older than 37 years of age and your health care provider tells you that you are at risk for this type of infection. ? Your sexual activity has changed since you were last screened, and you are at increased risk for chlamydia or gonorrhea. Ask your health care provider if   you are at risk.  Ask your health care provider about whether you are at high risk for HIV. Your health care provider may recommend a prescription medicine to help prevent HIV infection. If you choose to take medicine to prevent HIV, you should first get tested for HIV. You should then be tested every 3 months for as long as you are taking the medicine. Pregnancy  If you are about to stop having your period (premenopausal) and  you may become pregnant, seek counseling before you get pregnant.  Take 400 to 800 micrograms (mcg) of folic acid every day if you become pregnant.  Ask for birth control (contraception) if you want to prevent pregnancy. Osteoporosis and menopause Osteoporosis is a disease in which the bones lose minerals and strength with aging. This can result in bone fractures. If you are 65 years old or older, or if you are at risk for osteoporosis and fractures, ask your health care provider if you should:  Be screened for bone loss.  Take a calcium or vitamin D supplement to lower your risk of fractures.  Be given hormone replacement therapy (HRT) to treat symptoms of menopause. Follow these instructions at home: Lifestyle  Do not use any products that contain nicotine or tobacco, such as cigarettes, e-cigarettes, and chewing tobacco. If you need help quitting, ask your health care provider.  Do not use street drugs.  Do not share needles.  Ask your health care provider for help if you need support or information about quitting drugs. Alcohol use  Do not drink alcohol if: ? Your health care provider tells you not to drink. ? You are pregnant, may be pregnant, or are planning to become pregnant.  If you drink alcohol: ? Limit how much you use to 0-1 drink a day. ? Limit intake if you are breastfeeding.  Be aware of how much alcohol is in your drink. In the [place] one drink equals one 12 oz bottle of beer (355 mL), one 5 oz glass of wine (148 mL), or one 1 oz glass of hard liquor (44 mL). General instructions  Schedule regular health, dental, and eye exams.  Stay current with your vaccines.  Tell your health care provider if: ? You often feel depressed. ? You have ever been abused or do not feel safe at home. Summary  Adopting a healthy lifestyle and getting preventive care are important in promoting health and wellness.  Follow your health care provider's instructions about healthy  diet, exercising, and getting tested or screened for diseases.  Follow your health care provider's instructions on monitoring your cholesterol and blood pressure. This information is not intended to replace advice given to you by your health care provider. Make sure you discuss any questions you have with your health care provider. Document Revised: 05/26/2018 Document Reviewed: 05/26/2018 Elsevier Patient Education  2020 Elsevier Inc.  

## 2019-07-18 ENCOUNTER — Ambulatory Visit: Payer: Medicaid Other | Attending: Internal Medicine

## 2019-07-18 DIAGNOSIS — Z20822 Contact with and (suspected) exposure to covid-19: Secondary | ICD-10-CM

## 2019-07-18 LAB — CYTOLOGY - PAP
Adequacy: ABSENT
Diagnosis: NEGATIVE

## 2019-07-18 LAB — CERVICOVAGINAL ANCILLARY ONLY
Bacterial Vaginitis (gardnerella): POSITIVE — AB
Candida Glabrata: NEGATIVE
Candida Vaginitis: POSITIVE — AB
Chlamydia: NEGATIVE
Comment: NEGATIVE
Comment: NEGATIVE
Comment: NEGATIVE
Comment: NEGATIVE
Comment: NEGATIVE
Comment: NORMAL
Neisseria Gonorrhea: NEGATIVE
Trichomonas: NEGATIVE

## 2019-07-19 ENCOUNTER — Other Ambulatory Visit (INDEPENDENT_AMBULATORY_CARE_PROVIDER_SITE_OTHER): Payer: Self-pay | Admitting: Primary Care

## 2019-07-19 LAB — NOVEL CORONAVIRUS, NAA: SARS-CoV-2, NAA: NOT DETECTED

## 2019-07-19 MED ORDER — FLUCONAZOLE 150 MG PO TABS: 150.0000 mg | ORAL_TABLET | Freq: Once | ORAL | 2 refills | Status: AC

## 2019-07-19 MED ORDER — METRONIDAZOLE 500 MG PO TABS: 500.0000 mg | ORAL_TABLET | Freq: Two times a day (BID) | ORAL | 0 refills | Status: AC

## 2020-05-31 ENCOUNTER — Inpatient Hospital Stay: Admit: 2020-05-31 | Discharge: 2020-05-31 | Payer: PRIVATE HEALTH INSURANCE

## 2020-05-31 ENCOUNTER — Encounter: Admit: 2020-05-31 | Payer: PRIVATE HEALTH INSURANCE | Primary: Internal Medicine

## 2020-05-31 DIAGNOSIS — O009 Unspecified ectopic pregnancy without intrauterine pregnancy: Secondary | ICD-10-CM

## 2020-05-31 DIAGNOSIS — E669 Obesity, unspecified: Secondary | ICD-10-CM

## 2020-05-31 DIAGNOSIS — O8823 Thromboembolism in the puerperium: Secondary | ICD-10-CM

## 2020-05-31 DIAGNOSIS — I10 Essential (primary) hypertension: Secondary | ICD-10-CM

## 2020-06-01 NOTE — Discharge Instructions
Novel Coronavirus Disease 2019 (COVID-19)For any questions, please call [facility] COVID-19 Call Center at [phone], or toll-free at [phone].  Key PointsStay at home if you are sick. If you have questions about home quarantine or COVID-19, call the COVID-19 Call Center: [phone]. Please call ahead before visiting your doctor or any other healthcare facility.Wash your hands and cover your cough or sneeze. This is the best way to prevent spreading the disease. You can use a surgical facemask or cough/sneeze into your elbow. N-95 masks are not more effective and not necessary unless you are a Research scientist (physical sciences). Using or collecting N95 masks depletes critical supplies for hospitalized patients and healthcare workers. Seek further testing if instructed by your doctor. Call your doctor or the COVID-19 Call Center at [phone] for guidance. If you go to another emergency room or clinic for testing without guidance, you risk exposing yourself and others. You can self schedule by going to [email or web address] you were prescribed medications, please take them as directed. You do not need antibiotics for COVID-19 treatment. If you are already taking medications at home, continue to take them as instructed by your prescribing physician(s).Return to the emergency room if you develop difficulty breathing or other severe symptoms. Call 911 for guidance first, mention your worsening COVID-19 symptoms, and return by private car or ambulance. Avoid using public transportation to limit exposure to others. Do NOT visit another healthcare facility without speaking with 911, your doctor or the COVID-19 Call Center.  Novel Coronavirus Disease 2019 (COVID-19)				            [id number]  ([id number])Novel Coronavirus Disease 2019 (COVID-19)What happened during my emergency department visit today?Either you or your provider were concerned about the novel coronavirus disease 2019 (COVID-19) during your visit today. This packet contains information and guidance about COVID-19. It also includes the names of your emergency room team, all the tests that were ordered, any available results, and a list of treatments given (if any) during your visit. Please note that your emergency room diagnosis is preliminary and may not explain all your symptoms since it is based on just a single visit. Your best chance of recovery is to continue safe healthcare practices and maintain contact with the appropriate providers for further guidance and follow-up as instructed in this packet. You should also take your prescribed medications and undergo any additional testing recommended by your physician(s). If these instructions do not make sense to you or your caregiver(s) or if you have any questions, please call [phone] or toll-free, [phone].What is COVID-19?COVID-19 stands for ?coronavirus disease 2019.? This is a respiratory illness that can spread from person to person. Like the flu or common cold, COVID-19 is spread by droplets produced by infected persons when they sneeze or cough. The risk of infection is thought to be greatest when you are in prolonged close contact with an infected person (such as a family member). While it may also be possible to catch the virus by touching an infected surface and then touching your nose or mouth, this is probably not the primary way that the virus spreads. Because of this, practicing social distancing (such as avoiding travel, standing at least 6 feet away from other people, and avoiding shaking hands) is recommended. Masks should be worn in public, and in your home if you are unable to isolate yourself from others in your home.  Unlike the flu or common cold, the symptoms of COVID-19 tend to be more gradual. The most common symptoms are fever, cough and body aches.  The elderly, immunocompromised and people with lung or heart disease are at the highest risk of becoming very ill. Is there any treatment for Covid-19?Currently there are some medications that can be prescribed for the treatment of COVID-19 for eligible patients. Please call your doctor or the COVID-19 call center [phone]) to determine if you are eligible for a treatment referral. Antibiotics do not treat COVID-19 since it is viral, not bacterial. Most young and otherwise healthy people will get better at home in 1-2 weeks with supportive care, which includes rest, hydration and medicine to lower your fever such as acetaminophen (Tylenol) and ibuprofen (Motrin, Advil).What are my next steps?If you have further questions, call the [facility] COVID-19 Call Center at [phone] or toll-free at [phone]. Get the flu vaccine or a booster if you do not have one yet. Encourage your family members to do the same, especially if they are elderly.Stay at home if you are sick. If you need further guidance on home quarantine or have questions about COVID-19, call COVID-19 Call Center. Please call ahead before visiting your doctor.Wash your hands and cover your cough or sneeze. You can use a surgical facemask or cough/sneeze into your elbow. Dispose of tissues in a lined trash can and immediately wash your hands for at least 20 seconds. If soap and water are not available, you can use an alcohol-based hand sanitizer containing 60-95% alcohol, rubbing your hands together until they are dry. Soap and water are recommended for visibly soiled hands. Do not share dishes, bedding or other household items with others unless thoroughly washed. This is the best way to prevent spreading the disease. N-95 masks are NOT more effective than surgical facemasks and are only recommended for healthcare workers. Using or collecting N95 masks depletes critical supplies for hospitalized patients and healthcare workers. Seek further testing if instructed by your doctor. Call your doctor or the COVID-19 Call Center at [phone] for guidance. If you go to another emergency room or clinic for testing without guidance, you risk exposing yourself and others. You can self schedule by going to [email or web address] NOT visit another healthcare facility without first speaking with your doctor or our follow-up office. Contact your primary care doctor to evaluate your progress and recovery if instructed to do so. If you do not have a primary care doctor, you can call [phone] for a [facility] physician, or [phone] for more information or assistance in selecting a doctor. [facility] members should call the [facility] COVID-19 hotline at [phone].Please avoid social stigma or discrimination against Asian Americans, infected individuals and those under quarantine. Stigma hurts everyone by creating fear, anger and anxiety towards ordinary people without treating or preventing the underlying disease.What should I watch out for in the next few days?Currently, there is no approved treatment for COVID-19. If you start to develop any of the symptoms listed below, or your current symptoms worsen, call your healthcare provider or the COVID-19 Call Center for guidance. Call 911 if it is a medical emergency. Worsening fatigue/malaiseShortness of breathInability to keep fluids downNoticing darkened or decreased urine outputConfusion or altered mental statusAny other concerning symptomsWhere can I find more information about COVID-19?Since this is a new disease caused by a new virus, scientists and researchers are discovering new information every day. We strongly encourage you to check for updates from these official sources:The Centers for Disease Control and Prevention (CDC) COVID-19 https://dennis.info/ World Health Organization (WHO) Q&A on COVID-19https://www.who.int/news-room/q-a-detail/q-a-coronavirusesY[facility] http://[email or web address] [facility]  COVID-19 Vaccination [email or web address] University [email or web address]
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
## 2020-06-01 NOTE — ED Notes
11:17 PM Ambulatory sats 96-97%, pt asymptomatic w/ same

## 2020-09-13 LAB — TRICHOMONAS VAGINALIS BY NAAT: BKR TRICHOMONAS VAGINALIS NAAT: POSITIVE — AB

## 2020-09-13 LAB — HIV-1/HIV-2 ANTIBODY/ANTIGEN SCREEN W/REFLEX     (BH GH LMW YH): BKR HIV 1 AND 2 ANTIBODY/HIV-1 ANTIGEN SCREEN: NEGATIVE

## 2020-09-13 LAB — TREPONEMA PALLIDUM (SYPHILIS) ANTIBODY W/REFLEX
BKR TREPONEMA PALLIDUM ANTIBODY INITIAL RESULT: <0.1 {index}
BKR TREPONEMA PALLIDUM ANTIBODY TOTAL, SERUM: NONREACTIVE

## 2020-09-13 LAB — CHLAMYDIA TRACHOMATIS, NAAT (LAB ORDER ONLY) (BH GH L LMW YH): BKR CHLAMYDIA, DNA PROBE: NEGATIVE

## 2020-09-13 LAB — NEISSERIA GONORRHEA, NAAT (LAB ORDER ONLY)   (BH GH L LMW YH): BKR NEISSERIA GONORRHOEAE, DNA PROBE: NEGATIVE

## 2022-01-06 ENCOUNTER — Inpatient Hospital Stay: Admit: 2022-01-06 | Discharge: 2022-01-06 | Payer: MEDICAID | Attending: Emergency Medicine

## 2022-01-06 ENCOUNTER — Emergency Department: Admit: 2022-01-06 | Payer: MEDICAID | Primary: Internal Medicine

## 2022-01-06 DIAGNOSIS — Z9109 Other allergy status, other than to drugs and biological substances: Secondary | ICD-10-CM

## 2022-01-06 DIAGNOSIS — Z91013 Allergy to seafood: Secondary | ICD-10-CM

## 2022-01-06 DIAGNOSIS — Z86711 Personal history of pulmonary embolism: Secondary | ICD-10-CM

## 2022-01-06 DIAGNOSIS — I10 Essential (primary) hypertension: Secondary | ICD-10-CM

## 2022-01-06 DIAGNOSIS — M25561 Pain in right knee: Secondary | ICD-10-CM

## 2022-01-06 DIAGNOSIS — Z791 Long term (current) use of non-steroidal anti-inflammatories (NSAID): Secondary | ICD-10-CM

## 2022-01-06 NOTE — ED Provider Notes
Chief Complaint Patient presents with ? 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 Denies any known injury, thinks it's because she was on her feet so much. Walking with steady gait at triage.  MDM --------------------------------------------------------------------------------------------------------------------------------Resident NoteSynopsis:Pt is a 41 y.o. female w/ PMH of prior ectopic pregnancy, htn, prior PE 4 years ago per patient report who presents with right knee pain.  Right lateral knee.  Atraumatic, no falls.  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  No fever cough chest pain shortness of breath nausea vomiting diarrhea or dysuria.  Initial evaluation here notable for hypertension has normal vital signs, tenderness to right lateral anterior knee with tenderness also to the lateral part of the posterior popliteal fossa.  Differential is broad and includes DVT for which patient will receive DVT ultrasound, fracture which was thought much less likely given atraumatic, ligamentous injury which is possible given the patient's suspect she over did it by working two doubles followed by another day of work and experienced the pain afterward.  Of note, patient does not take any anticoagulant, though she did for brief time after her PE that she reports was about 4 years ago.Evaluated with Dr. Audie Clear, MD, MPHEmergency Medicine ResidentAvailable through MHB7/24/2023 9:30 AMThis note may have been generated using M*Modal voice dictation software please excuse any typographical errors due to flaws in voice recognition.This encounter occurred within the context of the Covid-19 pandemic. -------------------------------------------------------------------------------------------------------------------------------- Physical ExamED Triage Vitals [01/06/22 0821]BP: (!) 161/114Pulse: 79Pulse from  O2 sat: n/aResp: 18Temp: 98.4 ?F (36.9 ?C)Temp src: OralSpO2: 99 % BP (!) 161/114  - Pulse 79  - Temp 98.4 ?F (36.9 ?C) (Oral)  - Resp 18  - SpO2 99% Physical Exam ProceduresAttestation/Critical CarePatient Reevaluation: -------------------------------------------------------------ED Attending AddendumAttending Supervised: ResidentI saw and examined the patient. I agree with the findings and plan of care as documented in the resident's note except as noted below. 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 a CNAHas been ambulatory and wt-bearing No CP/SOBNo fever, constitutional s/sx, erythema, abnormal warmth, or pain on ROMAwake, alert, NAD, RLE distally NVI, 2+ DP pulse, brisk CR in toes, WWP, NTTP ankle/foot, SILT distally incl 1st dorsal webspace, no calf tenderness, neg Homan's, normal Thompson, able to SLR RLE, able to actively flex R knee to 90 degrees w/o pain on active ROM, no asymmetric warmth compared to contralateral kneeDVT [place] neg for DVT; however, I d/w pt limitations of [place] and need for r/p DVT US in 4-7d and pt expressed understanding and agreementXR neg for fxNo clinical e/o septic arthritisCurrently stable for trial of outpt mgmt w/ close PMD f/u, also counseled pt to f/u w/ ortho in 1wk if s/sx fail to improveI personally d/w pt strict RTED precautions (including, but not limited to, DVT/PE and septic arthritis return precautions) and pt expressed understanding and Wardell Honour, MDEmergency Medicine AttendingPlease call with questions - available via MHB-------------------------------------------------------------Clinical Impressions as of 01/06/22 1051 Right knee pain, unspecified chronicity  ED DispositionDischarge Ivor Messier, MD07/24/23 1100

## 2022-01-06 NOTE — Discharge Instructions
We hope that you feel better soon. A copy of your tests is provided with your discharge papers. If you have any problems with your prescriptions, or any problems after discharge, there is a nurse available Monday through Friday from 7:00am-3:30pm at [phone] to help answer any questions. You can also call the Emergency Department directly at [phone].Please follow up with your doctor as soon as possible.Please return if you develop fever not controlled by tylenol/ibuprofen, confusion or strange behavior, new/increased pain, difficulty breathing, uncontrollable vomiting, or any other new or concerning symptoms.  We are always open.

## 2022-01-06 NOTE — ED Notes
8:32 AM Pt arrived to walk in triage from home with c/o right sided knee pain. Pt endorses she worked 2 16 hour shifts in a row, and then her normal 12 hour shift on Wednesday and has had knee pain since. Pt endorses she has intermittent sharp shooting pain from her right lateral knee down her calf. Pt endorses her pain level fluctuates but gets to be as bad as 9/10. Pt denies any other complaints. Pt denies change of color/sensation to her knee or leg. Pt denies trauma. Pt is A&O x4, speaking in clear, complete sentences, ambulatory with limping gait. 9:32 AMPt to [place]. 10:04 AMPt returned from [place]. 11:26 AMPt DC. Pt educated on testing completed at this visit. Pt educated on need for follow up with orthopedics. Pt educated on home care and return precautions. Pt able to teach back all information provided. Pt ambulatory with steady gait out of the department.

## 2022-12-29 ENCOUNTER — Encounter: Admit: 2022-12-29 | Payer: PRIVATE HEALTH INSURANCE | Primary: Internal Medicine

## 2022-12-29 ENCOUNTER — Inpatient Hospital Stay: Admit: 2022-12-29 | Discharge: 2022-12-29 | Payer: MEDICAID

## 2022-12-29 DIAGNOSIS — I10 Essential (primary) hypertension: Secondary | ICD-10-CM

## 2022-12-29 DIAGNOSIS — E669 Obesity, unspecified: Secondary | ICD-10-CM

## 2022-12-29 DIAGNOSIS — O8823 Thromboembolism in the puerperium: Secondary | ICD-10-CM

## 2022-12-29 DIAGNOSIS — O009 Unspecified ectopic pregnancy without intrauterine pregnancy: Secondary | ICD-10-CM

## 2022-12-30 LAB — BACTERIAL VAGINOSIS BY NAAT: BKR BACTERIAL VAGINOSIS NAAT: POSITIVE — AB

## 2022-12-30 LAB — TREPONEMA PALLIDUM (SYPHILIS) ANTIBODY W/REFLEX
BKR TREPONEMA PALLIDUM ANTIBODY INITIAL RESULT: <0.1 {index}
BKR TREPONEMA PALLIDUM ANTIBODY TOTAL, SERUM: NONREACTIVE

## 2022-12-30 LAB — HIV-1/HIV-2 ANTIBODY/ANTIGEN SCREEN W/REFLEX     (BH GH LMW YH): BKR HIV 1 AND 2 ANTIBODY/HIV-1 ANTIGEN SCREEN: NEGATIVE

## 2022-12-30 MED ORDER — METRONIDAZOLE 500 MG TABLET
500 | ORAL_TABLET | Freq: Two times a day (BID) | ORAL | 1 refills | Status: AC
Start: 2022-12-30 — End: ?

## 2022-12-30 NOTE — ED Notes
9:43 PM - PT given d/c instructions.  PT verbalized understanding.  PT d/c home ambulatory by self.  Rubye Beach, RN

## 2022-12-30 NOTE — ED Provider Notes
Chief Complaint Patient presents with  Medical Screening   Pt requesting STD testing. Reports itching which is unusual for her. Denies discharge or pain.  PRESENTATION:  Pt is a 42 y.o. female PMHx of ectopic pregnancy, hypertension, partial hysterectomy, who presents to the emergency department for evaluation of STD screening, patient states she is currently sexually active and has not use protection and would like screening.  She denies any vaginal discharge, has not bleeding, urgency, frequency, hematuria, dysuria, abdominal pain, nausea/vomiting, chest pain, shortness of breath, fevers/chills.  Of note, she does endorse a lesion on her labia that has been present for a few days, she has not have pain, redness or itching from the area.Physical Exam:Vitals:  12/29/22 2039 BP: (!) 162/103 Pulse: 84 Resp: 18 Temp: 97.9 ?F (36.6 ?C) Patient in NAD, alert and oriented x 4, speaking in clear full sentencesThere is a 2 mm hard bump on the right labia, no drainage, warmth, fluctuanceAbdomen is soft, nontender, no rebound tenderness or guarding Lungs clear to auscultation bilaterally Regular rate and rhythmGU exam chaperoned by nurseDDX AND PLAN:Concern for:  Ingrown hair, STDWill:  STD screeningCOURSE:GU performed with nurse as chaperone, concern for ingrown hairPatient collected her own swabsMDM:Concern for STD, patient swabs herself.  Discussed with patient her results will be available on MyChart will call her.  Patient is agreeable with plan.  I discussed if she develops any new or worsening symptoms to return to the ER.  All questions answered prior to discharge.  Stable for DC at time.--------------------------------------------------------------------------------------------------------------------------------------------------------------------------------------------------Dr. Cammie Sickle Available for consult This patient was presented to and discussed with the supervising physician and a treatment plan and disposition were collaboratively agreed upon.Cline Cools PA-CAvailable on Mobile HB MDM  Physical ExamED Triage Vitals [12/29/22 2039]BP: (!) 162/103Pulse: 84Pulse from  O2 sat: n/aResp: 18Temp: 97.9 ?F (36.6 ?C)Temp src: OralSpO2: 98 % BP (!) 162/103  - Pulse 84  - Temp 97.9 ?F (36.6 ?C) (Oral)  - Resp 18  - Wt 79.8 kg  - SpO2 98%  - BMI 36.77 kg/m? Physical ExamVitals and nursing note reviewed. Genitourinary: ProceduresAttestation/Critical CareClinical Impressions as of 12/29/22 2244 Screening examination for STD (sexually transmitted disease)  ED DispositionDischarge Vevelyn Pat, PA07/15/24 2245

## 2022-12-30 NOTE — Telephone Encounter
Received +BV results for patient, and noted she had viewed this result in MyChart.  Chart reviewed and STD results are pending.  Case was discussed with Saintclair Halsted, PA.  A prescription for Flagyl was sent to [facility] [place] [facility], [facility].  Call was placed to cell number for her.  A voice message was left for her to return the call to the follow-up office and/or access her MyChart account for information.  A message was placed into MyChart regarding medications and instructions.  Call placed to home number for [facility].  Recording stated this number can not accept calls.

## 2022-12-30 NOTE — Discharge Instructions
[facility] [place] [facility] [place] [facility]Please return to the emergency department if you develop new or worsening symptoms Keep in mind that mild or early diseases may not be as obvious to us, so please return to the emergency department immediately if you have any questions or develop new, worsening, or otherwise concerning symptoms.  Also, just because we did not identify a life threat during your evaluation, does not mean that there are no further steps to working up your symptoms.

## 2022-12-30 NOTE — ED Triage Note
Provider in Triage Note38 y.o. year old female  with history of HTN, presents with STD check. Asymptomatic.Physical Exam: Ambulatory, abd softOrders placed in triage: yesDisposition: Express Care for further evaluation.Hamad Whyte, PA-C7/15/2024 8:37 PM

## 2022-12-31 LAB — CANDIDA & TRICHOMONAS VAGINITIS BY NAAT (BH GH LMW YH)
BKR CANDIDA GLABRATA NAAT: NEGATIVE
BKR CANDIDA SPECIES GROUP NAAT: POSITIVE — AB
BKR TRICHOMONAS VAGINALIS NAAT: NEGATIVE

## 2022-12-31 LAB — CHLAMYDIA TRACHOMATIS, NAAT (LAB ORDER ONLY) (BH GH L LMW YH): BKR CHLAMYDIA, DNA PROBE: NEGATIVE

## 2022-12-31 LAB — NEISSERIA GONORRHEA, NAAT (LAB ORDER ONLY)   (BH GH L LMW YH): BKR NEISSERIA GONORRHOEAE, DNA PROBE: POSITIVE — AB

## 2022-12-31 MED ORDER — CEFIXIME 400 MG CAPSULE
400 | ORAL_CAPSULE | Freq: Every day | ORAL | 1 refills | Status: AC
Start: 2022-12-31 — End: ?

## 2022-12-31 MED ORDER — FLUCONAZOLE 150 MG TABLET
150 | ORAL_TABLET | ORAL | 1 refills | Status: AC
Start: 2022-12-31 — End: ?

## 2022-12-31 MED ORDER — DOXYCYCLINE HYCLATE 100 MG CAPSULE
100 | ORAL_CAPSULE | Freq: Two times a day (BID) | ORAL | 1 refills | Status: AC
Start: 2022-12-31 — End: 2022-12-31

## 2023-01-01 NOTE — Telephone Encounter
Positive gonorrhea and candida results received in ED follow up, reviewed with Luster Landsberg PA. Prescriptions  for Diflucan and Suprax sent to [facility]. Call to pt to inform her and review instructions, vm left on mobile phone requesting return call. Home phone not accepting calls at this time. My chart message sent by  Sedalia Muta RN for pos BV seen .12/31/22 3:15 PM Second call to pt, VM left, return call received. Pt informed of results, prescriptions  reviewed. Pt verbalized understanding, advised  to inform her partner and refer for treatment, abstain from sexual activity for one week after both have been treated, use a condom when resuming sexual activity, and follow up with her provider. Pt  stated  she now uses [facility] , chart updated. Call to [facility]  , prescriptions  pulled to desired location. Per staff ,meds went through on pt's insurance, pt informed. DPH form completed, to be faxed by onsite nurse.

## 2023-01-25 ENCOUNTER — Inpatient Hospital Stay: Admit: 2023-01-25 | Discharge: 2023-01-25 | Payer: MEDICAID | Attending: Emergency Medicine

## 2023-01-25 ENCOUNTER — Encounter: Admit: 2023-01-25 | Payer: PRIVATE HEALTH INSURANCE

## 2023-01-25 DIAGNOSIS — O009 Unspecified ectopic pregnancy without intrauterine pregnancy: Secondary | ICD-10-CM

## 2023-01-25 DIAGNOSIS — I10 Essential (primary) hypertension: Secondary | ICD-10-CM

## 2023-01-25 DIAGNOSIS — O8823 Thromboembolism in the puerperium: Secondary | ICD-10-CM

## 2023-01-25 DIAGNOSIS — E669 Obesity, unspecified: Secondary | ICD-10-CM

## 2023-01-25 MED ORDER — DOXYCYCLINE HYCLATE 100 MG CAPSULE
100 | ORAL_CAPSULE | Freq: Two times a day (BID) | ORAL | 1 refills | Status: AC
Start: 2023-01-25 — End: 2023-01-25

## 2023-01-25 MED ORDER — METRONIDAZOLE 500 MG TABLET
500 | ORAL_TABLET | Freq: Two times a day (BID) | ORAL | 1 refills | Status: AC
Start: 2023-01-25 — End: 2023-01-25

## 2023-01-25 MED ORDER — DOXYCYCLINE TAB/CAP 100 MG (WRAPPED E-RX)
100 | Freq: Once | ORAL | Status: CP
Start: 2023-01-25 — End: ?
  Administered 2023-01-25: 18:00:00 100 mg via ORAL

## 2023-01-25 MED ORDER — METRONIDAZOLE 500 MG TABLET
500 | Freq: Once | ORAL | Status: CP
Start: 2023-01-25 — End: ?
  Administered 2023-01-25: 18:00:00 500 mg via ORAL

## 2023-01-25 MED ORDER — DOXYCYCLINE HYCLATE 100 MG CAPSULE
100 | ORAL_CAPSULE | Freq: Two times a day (BID) | ORAL | 1 refills | Status: AC
Start: 2023-01-25 — End: ?

## 2023-01-25 MED ORDER — CEFTRIAXONE 500 MG VIAL & 1% LIDOCAINE
Freq: Once | INTRAMUSCULAR | Status: CP
Start: 2023-01-25 — End: ?
  Administered 2023-01-25: 18:00:00 1.430 mL via INTRAMUSCULAR

## 2023-01-25 MED ORDER — METRONIDAZOLE 500 MG TABLET
500 | ORAL_TABLET | Freq: Two times a day (BID) | ORAL | 1 refills | Status: AC
Start: 2023-01-25 — End: ?

## 2023-01-25 NOTE — ED Provider Notes
Chief Complaint Patient presents with  Std Check   Pt here for follow up of DX of STI. Pt states I'm here to get a shot. Pt denies any symptoms at this time.  External data reviewed: Labs and Notes (OSH or non-ED)Care limited by SDOH: Ambulatory Care Access.  Physical ExamED Triage Vitals [01/25/23 1357]BP: (!) 179/117Pulse: 90Pulse from  O2 sat: n/aResp: 16Temp: 98.2 ?F (36.8 ?C)Temp src: OralSpO2: 97 % BP (!) 179/117  - Pulse 90  - Temp 98.2 ?F (36.8 ?C) (Oral)  - Resp 16  - SpO2 97% Physical Exam ProceduresAttestation/Critical CarePatient Reevaluation: 42 year old female presents emergency department for sexually transmitted disease medication administration.  She was seen in the ED approximately 1 month ago, and was discharged on fluconazole as well as cephalosporin and Flagyl treatment.  Her culture subsequently grew Candida, BV, chlamydia, gonorrhea.  She has been in [place] for the past few weeks and recently returned.  She believes that she was positive for gonorrhea Foley.  She did complete treatment for the Candida with fluconazole, but otherwise the medications when she went to the pharmacy were not there and so she has not been treated yet.  She denies symptoms of dysuria, hematuria, vaginal discharge.  Examination overall well-appearing.  No acute distress, nontoxic.  GU exam deferred.  Plan for medication administration here and on discharge for treatment of STIs.I am reachable on Mobile [employer], please call me with any questions.Lorrine Kin, MDAttending Physician, Emergency MedicineClinical Impressions as of 01/25/23 1906 Gonorrhea Bacterial vaginosis Chlamydia  ED DispositionDischarge Lorrine Kin, MD08/11/24 1401 Lorrine Kin, MD08/11/24 1906

## 2023-05-09 ENCOUNTER — Emergency Department: Admit: 2023-05-09 | Payer: MEDICAID

## 2023-05-09 ENCOUNTER — Inpatient Hospital Stay: Admit: 2023-05-09 | Discharge: 2023-05-09 | Payer: MEDICAID | Attending: Emergency Medicine

## 2023-05-09 DIAGNOSIS — R112 Nausea with vomiting, unspecified: Principal | ICD-10-CM

## 2023-05-09 DIAGNOSIS — R059 Cough, unspecified: Secondary | ICD-10-CM

## 2023-05-09 MED ORDER — ORAL/ENTERAL REHYDRATION FLUIDS (ELECTROLYTE DRINK/WATER)
ORAL | Status: DC
Start: 2023-05-09 — End: 2023-05-10

## 2023-05-09 MED ORDER — BENZONATATE 100 MG CAPSULE
100 mg | ORAL_CAPSULE | Freq: Three times a day (TID) | ORAL | 1 refills | Status: AC
Start: 2023-05-09 — End: ?

## 2023-05-09 MED ORDER — METOCLOPRAMIDE 10 MG TABLET
10 mg | ORAL_TABLET | Freq: Four times a day (QID) | ORAL | 1 refills | Status: AC | PRN
Start: 2023-05-09 — End: ?

## 2023-05-09 MED ORDER — METOCLOPRAMIDE 5 MG/ML INJECTION SOLUTION
5 | INTRAVENOUS | Status: CP
Start: 2023-05-09 — End: ?
  Administered 2023-05-09: 5 mL via INTRAVENOUS

## 2023-05-09 MED ORDER — ALUMINUM-MAG HYDROXIDE-SIMETHICONE 200 MG-200 MG-20 MG/5 ML ORAL SUSP
200-200-20 | Freq: Once | ORAL | Status: CP
Start: 2023-05-09 — End: ?
  Administered 2023-05-09: 200-200-20 mL via ORAL

## 2023-05-09 MED ORDER — FAMOTIDINE 20 MG TABLET
20 mg | ORAL_TABLET | Freq: Two times a day (BID) | ORAL | 1 refills | Status: AC
Start: 2023-05-09 — End: ?

## 2023-05-09 MED ORDER — FAMOTIDINE 20 MG TABLET
20 | Freq: Once | ORAL | Status: CP
Start: 2023-05-09 — End: ?
  Administered 2023-05-09: 20 mg via ORAL

## 2023-05-09 NOTE — ED Triage Note
 Provider in Triage Note76 y.o. year old female  presents with nausea, vomiting last night x 3 and saw some blood in vomit near end with pain near throat afterwards, no blood with coughing or bleeding elsewhere. Had some LUQ pain earlier which has improved. No blood thinnersPhysical Exam: awake and alert, stable appearing, answering questions in full sentencesOrders placed in triage: yesDisposition: Main Emergency Department for further evaluation.Bridgit Eynon E Srica11/23/2024 6:21 PM

## 2023-05-10 DIAGNOSIS — Z91013 Allergy to seafood: Secondary | ICD-10-CM

## 2023-05-10 DIAGNOSIS — J029 Acute pharyngitis, unspecified: Secondary | ICD-10-CM

## 2023-05-10 DIAGNOSIS — Z91048 Other nonmedicinal substance allergy status: Secondary | ICD-10-CM

## 2023-05-10 DIAGNOSIS — R042 Hemoptysis: Secondary | ICD-10-CM

## 2023-05-10 LAB — BASIC METABOLIC PANEL
BKR ANION GAP: 12 (ref 7–17)
BKR BLOOD UREA NITROGEN: 17 mg/dL (ref 6–20)
BKR BUN / CREAT RATIO: 21.3 (ref 8.0–23.0)
BKR CALCIUM: 9.3 mg/dL (ref 8.8–10.2)
BKR CHLORIDE: 103 mmol/L (ref 98–107)
BKR CO2: 24 mmol/L (ref 20–30)
BKR CREATININE: 0.8 mg/dL (ref 0.40–1.30)
BKR EGFR, CREATININE (CKD-EPI 2021): >60 mL/min/{1.73_m2} (ref >=60)
BKR GLUCOSE: 116 mg/dL — ABNORMAL HIGH (ref 70–100)
BKR POTASSIUM: 3.3 mmol/L (ref 3.3–5.3)
BKR SODIUM: 139 mmol/L (ref 136–144)

## 2023-05-10 LAB — SARS-COV-2 (COVID-19)/INFLUENZA A+B/RSV BY RT-PCR (BH GH LMW YH)
BKR INFLUENZA A: NEGATIVE
BKR INFLUENZA B: NEGATIVE
BKR RESPIRATORY SYNCYTIAL VIRUS: NEGATIVE
BKR SARS-COV-2 RNA (COVID-19) (YH): NEGATIVE

## 2023-05-10 LAB — CBC WITH AUTO DIFFERENTIAL
BKR WAM ABSOLUTE IMMATURE GRANULOCYTES.: 0.01 x 1000/ÂµL (ref 0.00–0.30)
BKR WAM ABSOLUTE LYMPHOCYTE COUNT.: 1.63 x 1000/ÂµL (ref 0.60–3.70)
BKR WAM ABSOLUTE NRBC (2 DEC): 0 x 1000/ÂµL (ref 0.00–1.00)
BKR WAM ANC (ABSOLUTE NEUTROPHIL COUNT): 1.59 x 1000/ÂµL — ABNORMAL LOW (ref 2.00–7.60)
BKR WAM BASOPHIL ABSOLUTE COUNT.: 0.03 x 1000/ÂµL (ref 0.00–1.00)
BKR WAM BASOPHILS: 0.8 % (ref 0.0–1.4)
BKR WAM EOSINOPHIL ABSOLUTE COUNT.: 0.1 x 1000/ÂµL (ref 0.00–1.00)
BKR WAM EOSINOPHILS: 2.7 % (ref 0.0–5.0)
BKR WAM HEMATOCRIT (2 DEC): 43.1 % (ref 35.00–45.00)
BKR WAM HEMOGLOBIN: 14.3 g/dL (ref 11.7–15.5)
BKR WAM IMMATURE GRANULOCYTES: 0.3 % (ref 0.0–1.0)
BKR WAM LYMPHOCYTES: 44.8 % (ref 17.0–50.0)
BKR WAM MCH (PG): 29.5 pg (ref 27.0–33.0)
BKR WAM MCHC: 33.2 g/dL (ref 31.0–36.0)
BKR WAM MCV: 89 fL (ref 80.0–100.0)
BKR WAM MONOCYTE ABSOLUTE COUNT.: 0.28 x 1000/ÂµL (ref 0.00–1.00)
BKR WAM MONOCYTES: 7.7 % (ref 4.0–12.0)
BKR WAM MPV: 9.8 fL (ref 8.0–12.0)
BKR WAM NEUTROPHILS: 43.7 % (ref 39.0–72.0)
BKR WAM NUCLEATED RED BLOOD CELLS: 0 % (ref 0.0–1.0)
BKR WAM PLATELETS: 255 x1000/ÂµL (ref 150–420)
BKR WAM RDW-CV: 12.7 % (ref 11.0–15.0)
BKR WAM RED BLOOD CELL COUNT.: 4.84 M/ÂµL (ref 4.00–6.00)
BKR WAM WHITE BLOOD CELL COUNT: 3.6 x1000/ÂµL — ABNORMAL LOW (ref 4.0–11.0)

## 2023-05-10 LAB — URINALYSIS WITH CULTURE REFLEX      (BH LMW YH)
BKR BILIRUBIN, UA: NEGATIVE
BKR BLOOD, UA: NEGATIVE
BKR GLUCOSE, UA: NEGATIVE
BKR KETONES, UA: NEGATIVE
BKR LEUKOCYTE ESTERASE, UA: NEGATIVE
BKR NITRITE, UA: NEGATIVE
BKR PH, UA: 6 (ref 5.5–7.5)
BKR PROTEIN, UA: NEGATIVE
BKR SPECIFIC GRAVITY, UA: 1.013 (ref 1.005–1.030)
BKR UROBILINOGEN, UA (MG/DL): <2 mg/dL (ref <=2.0)

## 2023-05-10 LAB — FENTANYL, URINE, WITH NO CONFIRMATION (BH GH L LMW YH): BKR FENTANYL: NEGATIVE

## 2023-05-10 LAB — URINE DRUG SCREEN W/ NO CONF   (BH GH LMH YH)
BKR AMPHETAMINE SCREEN, URINE, NO CONF.: NEGATIVE
BKR BARBITURATE SCREEN, URINE, NO CONF.: NEGATIVE
BKR BENZODIAZEPINES SCREEN, URINE, NO CONF.: NEGATIVE
BKR CANNABINOIDS SCREEN, URINE, NO CONF.: POSITIVE — AB
BKR COCAINE SCREEN, URINE, NO CONF.: NEGATIVE
BKR METHADONE METABOLITE SCREEN, URINE, NO CONF.: NEGATIVE
BKR OPIATES SCREEN, URINE, NO CONF.: NEGATIVE
BKR OXYCODONE SCREEN, URINE, NO CONF.: NEGATIVE
BKR PHENCYCLIDINE (PCP) SCREEN, URINE, NO CONF.: NEGATIVE

## 2023-05-10 LAB — HEPATIC FUNCTION PANEL
BKR A/G RATIO: 1.5 (ref 1.0–2.2)
BKR ALANINE AMINOTRANSFERASE (ALT): 16 U/L (ref 10–35)
BKR ALBUMIN: 4.3 g/dL (ref 3.6–5.1)
BKR ALKALINE PHOSPHATASE: 87 U/L (ref 9–122)
BKR ASPARTATE AMINOTRANSFERASE (AST): 20 U/L (ref 10–35)
BKR AST/ALT RATIO: 1.3
BKR BILIRUBIN DIRECT: <0.2 mg/dL (ref <=0.3)
BKR BILIRUBIN TOTAL: 0.4 mg/dL (ref <=1.2)
BKR GLOBULIN: 2.9 g/dL (ref 2.0–3.9)
BKR PROTEIN TOTAL: 7.2 g/dL (ref 5.9–8.3)

## 2023-05-10 LAB — PROTIME AND INR
BKR INR: 1.01 (ref 0.87–1.13)
BKR PROTHROMBIN TIME: 10.9 s (ref 9.5–12.1)

## 2023-05-10 LAB — D-DIMER, QUANTITATIVE: BKR D-DIMER: 0.2 mg{FEU}/L (ref <=0.50)

## 2023-05-10 LAB — UA REFLEX CULTURE

## 2023-05-10 LAB — LIPASE: BKR LIPASE: 12 U/L (ref 11–55)

## 2023-05-10 LAB — GROUP A STREPTOCOCCUS BY PCR     (BH GH LMW YH): BKR GROUP A STREP PCR: NOT DETECTED

## 2023-05-10 NOTE — ED Notes
7:15 PM Assumed care of patient.
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
## 2023-05-11 NOTE — Telephone Encounter
 Courtesy call placed to the number for Roshawnda to see how she is feeling and to assist with establishing primary care if agreeable.  Spoke with the none she reported she is feeling much better today.  She does not have a PCP but was willing to receive a list of providers accepting new patients to her verified e-mail, which was done.  She was appreciative of the call and assistance.

## 2023-07-19 ENCOUNTER — Emergency Department: Admit: 2023-07-19 | Payer: MEDICAID | Attending: Diagnostic Radiology

## 2023-07-19 ENCOUNTER — Emergency Department: Admit: 2023-07-19 | Payer: MEDICAID

## 2023-07-19 ENCOUNTER — Inpatient Hospital Stay
Admit: 2023-07-19 | Discharge: 2023-07-20 | Payer: MEDICAID | Attending: Student in an Organized Health Care Education/Training Program

## 2023-07-19 DIAGNOSIS — R072 Precordial pain: Secondary | ICD-10-CM

## 2023-07-19 DIAGNOSIS — M25561 Pain in right knee: Secondary | ICD-10-CM

## 2023-07-19 DIAGNOSIS — M25511 Pain in right shoulder: Secondary | ICD-10-CM

## 2023-07-19 LAB — CBC WITH AUTO DIFFERENTIAL
BKR WAM ABSOLUTE IMMATURE GRANULOCYTES.: 0.01 x 1000/ÂµL (ref 0.00–0.30)
BKR WAM ABSOLUTE LYMPHOCYTE COUNT.: 1.02 x 1000/ÂµL (ref 0.60–3.70)
BKR WAM ABSOLUTE NRBC (2 DEC): 0 x 1000/ÂµL (ref 0.00–1.00)
BKR WAM ANC (ABSOLUTE NEUTROPHIL COUNT): 1.8 x 1000/ÂµL — ABNORMAL LOW (ref 2.00–7.60)
BKR WAM BASOPHIL ABSOLUTE COUNT.: 0.02 x 1000/ÂµL (ref 0.00–1.00)
BKR WAM BASOPHILS: 0.6 % (ref 0.0–1.4)
BKR WAM EOSINOPHIL ABSOLUTE COUNT.: 0.1 x 1000/ÂµL (ref 0.00–1.00)
BKR WAM EOSINOPHILS: 3.2 % (ref 0.0–5.0)
BKR WAM HEMATOCRIT (2 DEC): 42.6 % (ref 35.00–45.00)
BKR WAM HEMOGLOBIN: 14.5 g/dL (ref 11.7–15.5)
BKR WAM IMMATURE GRANULOCYTES: 0.3 % (ref 0.0–1.0)
BKR WAM LYMPHOCYTES: 32.2 % (ref 17.0–50.0)
BKR WAM MCH (PG): 28.8 pg (ref 27.0–33.0)
BKR WAM MCHC: 34 g/dL (ref 31.0–36.0)
BKR WAM MCV: 84.5 fL (ref 80.0–100.0)
BKR WAM MONOCYTE ABSOLUTE COUNT.: 0.22 x 1000/ÂµL (ref 0.00–1.00)
BKR WAM MONOCYTES: 6.9 % (ref 4.0–12.0)
BKR WAM MPV: 10.1 fL (ref 8.0–12.0)
BKR WAM NEUTROPHILS: 56.8 % (ref 39.0–72.0)
BKR WAM NUCLEATED RED BLOOD CELLS: 0 % (ref 0.0–1.0)
BKR WAM PLATELETS: 236 x1000/ÂµL (ref 150–420)
BKR WAM RDW-CV: 12.6 % (ref 11.0–15.0)
BKR WAM RED BLOOD CELL COUNT.: 5.04 M/ÂµL (ref 4.00–6.00)
BKR WAM WHITE BLOOD CELL COUNT: 3.2 x1000/ÂµL — ABNORMAL LOW (ref 4.0–11.0)

## 2023-07-19 LAB — BASIC METABOLIC PANEL
BKR ANION GAP: 12 (ref 7–17)
BKR BLOOD UREA NITROGEN: 11 mg/dL (ref 6–20)
BKR BUN / CREAT RATIO: 14.5 (ref 8.0–23.0)
BKR CALCIUM: 8.8 mg/dL (ref 8.8–10.2)
BKR CHLORIDE: 105 mmol/L (ref 98–107)
BKR CO2: 22 mmol/L (ref 20–30)
BKR CREATININE DELTA: -0.04
BKR CREATININE: 0.76 mg/dL (ref 0.40–1.30)
BKR EGFR, CREATININE (CKD-EPI 2021): >60 mL/min/{1.73_m2} (ref >=60)
BKR GLUCOSE: 96 mg/dL (ref 70–100)
BKR POTASSIUM: 3.3 mmol/L (ref 3.3–5.3)
BKR SODIUM: 139 mmol/L (ref 136–144)

## 2023-07-19 LAB — ALCOHOL PANEL, GC (BH LM YH)
BKR ACETONE BLOOD2: NOT DETECTED mg/dL
BKR ETHANOL2 (GC): NOT DETECTED mg/dL
BKR ISOPROPANOL BLOOD2: NOT DETECTED mg/dL
BKR METHANOL2: NOT DETECTED mg/dL

## 2023-07-19 MED ORDER — ACETAMINOPHEN 325 MG TABLET
325 | Freq: Once | ORAL | Status: CP
Start: 2023-07-19 — End: ?
  Administered 2023-07-19: 21:00:00 325 mg via ORAL

## 2023-07-19 MED ORDER — MORPHINE 4 MG/ML INTRAVENOUS SOLUTION
4 | Freq: Once | INTRAVENOUS | Status: CP
Start: 2023-07-19 — End: ?
  Administered 2023-07-19: 20:00:00 4 mL via INTRAVENOUS

## 2023-07-19 MED ORDER — LIDOCAINE 4 % TOPICAL PATCH
4 | MEDICATED_PATCH | TRANSDERMAL | 1 refills | Status: AC
Start: 2023-07-19 — End: ?
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
## 2023-07-19 NOTE — ED Notes
 7:44 PM

## 2023-07-20 DIAGNOSIS — Z91013 Allergy to seafood: Secondary | ICD-10-CM

## 2023-07-20 DIAGNOSIS — M25561 Pain in right knee: Secondary | ICD-10-CM

## 2023-07-20 DIAGNOSIS — Z9109 Other allergy status, other than to drugs and biological substances: Secondary | ICD-10-CM

## 2023-07-20 DIAGNOSIS — R079 Chest pain, unspecified: Secondary | ICD-10-CM

## 2023-07-20 DIAGNOSIS — R519 Headache, unspecified: Secondary | ICD-10-CM

## 2023-07-20 DIAGNOSIS — M25562 Pain in left knee: Secondary | ICD-10-CM

## 2023-07-20 DIAGNOSIS — M25572 Pain in left ankle and joints of left foot: Secondary | ICD-10-CM

## 2023-07-20 DIAGNOSIS — M25511 Pain in right shoulder: Secondary | ICD-10-CM

## 2023-07-20 DIAGNOSIS — I10 Essential (primary) hypertension: Secondary | ICD-10-CM

## 2023-07-20 LAB — EXTRA BLUE TOP SPECIMEN     (BH GH LMW)
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
## 2023-08-17 ENCOUNTER — Inpatient Hospital Stay
Admit: 2023-08-17 | Discharge: 2023-08-18 | Payer: MEDICAID | Attending: Student in an Organized Health Care Education/Training Program

## 2023-08-17 NOTE — ED Notes
 Elevated BP at work. Reports she hag BP issues when pregnant but none since. +SOB-ongoing x few days with pain under left ribs. Given amlodipine at work

## 2023-08-18 ENCOUNTER — Emergency Department: Admit: 2023-08-18 | Payer: MEDICAID

## 2023-08-18 ENCOUNTER — Emergency Department: Admit: 2023-08-18 | Payer: MEDICAID | Attending: Diagnostic Radiology

## 2023-08-18 DIAGNOSIS — Z91013 Allergy to seafood: Secondary | ICD-10-CM

## 2023-08-18 DIAGNOSIS — Z20822 Contact with and (suspected) exposure to covid-19: Secondary | ICD-10-CM

## 2023-08-18 DIAGNOSIS — R0602 Shortness of breath: Secondary | ICD-10-CM

## 2023-08-18 DIAGNOSIS — R519 Headache, unspecified: Secondary | ICD-10-CM

## 2023-08-18 DIAGNOSIS — Z9109 Other allergy status, other than to drugs and biological substances: Secondary | ICD-10-CM

## 2023-08-18 DIAGNOSIS — I10 Essential (primary) hypertension: Secondary | ICD-10-CM

## 2023-08-18 LAB — CBC WITH AUTO DIFFERENTIAL
BKR WAM ABSOLUTE IMMATURE GRANULOCYTES.: 0.01 x 1000/ÂµL (ref 0.00–0.30)
BKR WAM ABSOLUTE LYMPHOCYTE COUNT.: 1.79 x 1000/ÂµL (ref 0.60–3.70)
BKR WAM ABSOLUTE NRBC (2 DEC): 0 x 1000/ÂµL (ref 0.00–1.00)
BKR WAM ANC (ABSOLUTE NEUTROPHIL COUNT): 1.73 x 1000/ÂµL — ABNORMAL LOW (ref 2.00–7.60)
BKR WAM BASOPHIL ABSOLUTE COUNT.: 0.03 x 1000/ÂµL (ref 0.00–1.00)
BKR WAM BASOPHILS: 0.8 % (ref 0.0–1.4)
BKR WAM EOSINOPHIL ABSOLUTE COUNT.: 0.13 x 1000/ÂµL (ref 0.00–1.00)
BKR WAM EOSINOPHILS: 3.3 % (ref 0.0–5.0)
BKR WAM HEMATOCRIT (2 DEC): 42.2 % (ref 35.00–45.00)
BKR WAM HEMOGLOBIN: 14.4 g/dL (ref 11.7–15.5)
BKR WAM IMMATURE GRANULOCYTES: 0.3 % (ref 0.0–1.0)
BKR WAM LYMPHOCYTES: 45.4 % (ref 17.0–50.0)
BKR WAM MCH (PG): 29.7 pg (ref 27.0–33.0)
BKR WAM MCHC: 34.1 g/dL (ref 31.0–36.0)
BKR WAM MCV: 87 fL (ref 80.0–100.0)
BKR WAM MONOCYTE ABSOLUTE COUNT.: 0.25 x 1000/ÂµL (ref 0.00–1.00)
BKR WAM MONOCYTES: 6.3 % (ref 4.0–12.0)
BKR WAM MPV: 9.4 fL (ref 8.0–12.0)
BKR WAM NEUTROPHILS: 43.9 % (ref 39.0–72.0)
BKR WAM NUCLEATED RED BLOOD CELLS: 0 % (ref 0.0–1.0)
BKR WAM PLATELETS: 292 x1000/ÂµL (ref 150–420)
BKR WAM RDW-CV: 12.9 % (ref 11.0–15.0)
BKR WAM RED BLOOD CELL COUNT.: 4.85 M/ÂµL (ref 4.00–6.00)
BKR WAM WHITE BLOOD CELL COUNT: 3.9 x1000/ÂµL — ABNORMAL LOW (ref 4.0–11.0)

## 2023-08-18 LAB — TROPONIN T HIGH SENSITIVITY, 0 HOUR BASELINE WITH REFLEX (BH GH LMW YH): BKR TROPONIN T HS 0 HOUR BASELINE: <6 ng/L

## 2023-08-18 LAB — SARS-COV-2 (COVID-19)/INFLUENZA A+B/RSV BY RT-PCR (BH GH LMW YH)
BKR INFLUENZA A: NEGATIVE
BKR INFLUENZA B: NEGATIVE
BKR RESPIRATORY SYNCYTIAL VIRUS: NEGATIVE
BKR SARS-COV-2 RNA (COVID-19) (YH): NEGATIVE

## 2023-08-18 LAB — TSH W/REFLEX TO FT4     (BH GH LMW Q YH): BKR THYROID STIMULATING HORMONE: 1.14 u[IU]/mL

## 2023-08-18 LAB — BASIC METABOLIC PANEL
BKR ANION GAP: 10 (ref 7–17)
BKR BLOOD UREA NITROGEN: 12 mg/dL (ref 6–20)
BKR BUN / CREAT RATIO: 16 (ref 8.0–23.0)
BKR CALCIUM: 9 mg/dL (ref 8.8–10.2)
BKR CHLORIDE: 103 mmol/L (ref 98–107)
BKR CO2: 26 mmol/L (ref 20–30)
BKR CREATININE DELTA: -0.01
BKR CREATININE: 0.75 mg/dL (ref 0.40–1.30)
BKR EGFR, CREATININE (CKD-EPI 2021): >60 mL/min/{1.73_m2} (ref >=60)
BKR GLUCOSE: 94 mg/dL (ref 70–100)
BKR POTASSIUM: 3.7 mmol/L (ref 3.3–5.3)
BKR SODIUM: 139 mmol/L (ref 136–144)

## 2023-08-18 LAB — LIPASE: BKR LIPASE: 22 U/L (ref 11–55)

## 2023-08-18 LAB — HEPATIC FUNCTION PANEL
BKR A/G RATIO: 1.2 (ref 1.0–2.2)
BKR ALANINE AMINOTRANSFERASE (ALT): 20 U/L (ref 10–35)
BKR ALBUMIN: 4 g/dL (ref 3.6–5.1)
BKR ALKALINE PHOSPHATASE: 91 U/L (ref 9–122)
BKR ASPARTATE AMINOTRANSFERASE (AST): 37 U/L — ABNORMAL HIGH (ref 10–35)
BKR AST/ALT RATIO: 1.9
BKR BILIRUBIN TOTAL: 0.2 mg/dL (ref <=1.2)
BKR GLOBULIN: 3.3 g/dL (ref 2.0–3.9)
BKR PROTEIN TOTAL: 7.3 g/dL (ref 5.9–8.3)

## 2023-08-18 LAB — D-DIMER, QUANTITATIVE: BKR D-DIMER: 0.28 mg{FEU}/L (ref <=0.50)

## 2023-08-18 LAB — MAGNESIUM: BKR MAGNESIUM: 2.1 mg/dL (ref 1.7–2.4)

## 2023-08-18 LAB — NT-PROBNPE: BKR B-TYPE NATRIURETIC PEPTIDE, PRO (PROBNP): 202 pg/mL — ABNORMAL HIGH (ref <125.0)

## 2023-08-18 MED ORDER — HYDROCHLOROTHIAZIDE 25 MG TABLET
25 | ORAL_TABLET | Freq: Every day | ORAL | 1 refills | Status: AC
Start: 2023-08-18 — End: ?

## 2023-08-18 NOTE — Discharge Instructions
 You were seen at the [facility] Emergency Department on 08/18/2023 for high blood pressure.Please pickup your prescription for the additional BP medication. We have scheduled a follow up appointment for next Monday so that you can have your BP rechecked and they can check your electrolytes.If you have any questions regarding your visit or instructions, please call our Follow-Up Nurse at [phone] Monday-Friday 7:00 am- 3:30 pm.No adjustments were made to your current home medication regimen. You will need to follow up with your primary care doctor within a week.  Call your doctor's office as soon as possible to schedule an appointment and to let your doctor know that your condition required a visit to the Emergency Room.Return to ED if any of the following develop:- Worsening of your current symptoms- Chest pain and/or shortness of breath- Fever above 100.4 - Vomiting that does not stop- Any other symptoms that you find concerningThank you for trusting your care to [place]. It was our privilege to care for you today. Please do not hesitate to return to the Emergency Department should you feel the need.

## 2023-08-18 NOTE — Care Coordination-Inpatient
 It is noted Tracie Gardner has a new patient appt on 09/24/23 and was given an ECC appt on 08/25/23.  On discharge she was prescribed HTCZ to manage her blood pressure

## 2023-08-18 NOTE — ED Provider Notes
 Chief Complaint Patient presents with  Hypertension   Elevated BP at work. Reports she hag BP issues when pregnant but none since. +SOB-ongoing x few days with pain under left ribs. 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 She reports that she has been having intermittent dyspnea on exertion, left sided chest pain and frontal headaches for the past few days. Her symptoms were bothering her at work today, at which time she checked her BP and noted it to be 160/103. She works at a medical clinic, and a provider there prescribed her Amlodipine 5mg , and she took one dose earlier this evening. Her BP did not get any lower, so she came to the ED for further evaluation. She denies any current symptoms. Patient denies current chest pain, shortness of breath, cough, fever, headache, vision changes, leg pain or swelling, numbness/tingling/weakness in extremities, abdominal pain, nausea, vomiting, diarrhea, dysuria and any other problems at this time. Exam notable [id number]. Normal S1S2; RRR; No murmurs, rubs or gallops. Lungs clear to auscultation bilaterally. Abdomen soft, nontender. No gross focal neurological deficits.MDM/ED Course:Will check ECG, troponins to risk stratify for ACS. Also check D-dimer to further risk stratify for PE. Check BNP and bedside echo to assess for CHF. Will also check CBC to assess for anemia/leukocytosis, BMP and Mg to assess for electrolyte derangements/AKI, LFTs to assess for biliary pathological processes, lipase to assess for pancreatitis, CXR to assess for pneumonia, pregnancy test and COVID/Flu/RSV swabI reviewed ECG: NSR with no acute ischemic changesBedside echo without any obvious abnormalities.Trop <6, Dimer negative. Labs reviewed; no notable clinically significant abnormalities. On reassessment, patient remains asymptomatic. Will start on HCTZ and she will follow up in [facility] in one week. She also has upcoming PCP appointment next month.Discussed results with patient and provided reassurance. Patient instructed to follow up with their PCP within 2-3 business days. Discussed strict return precautions and questions answered. Patient comfortable being discharged home at this time.DISPO: dischargePatient verbalized understanding and agreeable with plan.Seen under supervision of Dr. Janice Norrie, MDResident Physician, PGY-4[facility] MedicineAvailable on Mobile HeartbeatMDM  Physical ExamED Triage Vitals [08/17/23 2319]BP: (!) 180/130Pulse: (!) 93Pulse from  O2 sat: n/aResp: 18Temp: 97.5 ?F (36.4 ?C)Temp src: TemporalSpO2: 99 % BP (!) 173/111  - Pulse 77  - Temp 98 ?F (36.7 ?C) (Oral)  - Resp 18  - SpO2 98% Physical Exam ProceduresAttestation/Critical CarePatient Reevaluation: Attending Supervised: ResidentI saw and examined the patient. I agree with the findings and plan of care as documented in the resident's note except as noted below. Patient presented for significant hypertension and some mild dyspnea.  It sounds like she was had some progressively worsening hypertension for a prolonged period of time.  The office what she works prescribed amlodipine for her but she would not notice any significant improvement.  I added hydrochlorothiazide and arranged ECC follow up for the patient.  She it is currently working on getting a new primary care doctor.Acute and/or chronic problems addressed: Hypertension, unspecified type  (primary encounter diagnosis)Shortness of breathDisposition: DischargeNicholas Cochran-caggianoClinical Impressions as of 08/18/23 0251 Hypertension, unspecified type Shortness of breath  ED DispositionDischarge  Darron Doom, MDResident03/04/25 0232 Theressa Stamps, MD03/04/25 [phone]

## 2023-08-18 NOTE — ED Notes
 11:39 PMPatient arrived to room. AAO x4. Ambulatory. NAD noted at this time. Pt reports headache and ocular pressure prior to ED visit. Pending MD to bedside for further evaluation.Chief Complaint Patient presents with  Hypertension   Elevated BP at work. Reports she hag BP issues when pregnant but none since. +SOB-ongoing x few days with pain under left ribs. Given amlodipine at work Past Medical History: Diagnosis Date  Ectopic pregnancy   Hypertension   Obesity   BMI 47  Obstetric pulmonary embolism, postpartum  1:04 AM Pt taken to Radiology2:38 PMAVS printed. D/c teaching performed. Pt verbalized understanding of all d/c instruction. PIV removed.

## 2023-08-24 ENCOUNTER — Encounter: Admit: 2023-08-24 | Payer: MEDICAID

## 2023-08-25 ENCOUNTER — Encounter: Admit: 2023-08-25 | Payer: PRIVATE HEALTH INSURANCE

## 2023-11-24 ENCOUNTER — Emergency Department: Admit: 2023-11-24 | Payer: MEDICAID

## 2023-11-24 ENCOUNTER — Inpatient Hospital Stay: Admit: 2023-11-24 | Discharge: 2023-11-24 | Payer: MEDICAID | Attending: Emergency Medicine

## 2023-11-24 DIAGNOSIS — Z9109 Other allergy status, other than to drugs and biological substances: Secondary | ICD-10-CM

## 2023-11-24 DIAGNOSIS — R002 Palpitations: Secondary | ICD-10-CM

## 2023-11-24 DIAGNOSIS — Z91148 Patient's other noncompliance with medication regimen for other reason: Secondary | ICD-10-CM

## 2023-11-24 DIAGNOSIS — Z91013 Allergy to seafood: Secondary | ICD-10-CM

## 2023-11-24 DIAGNOSIS — R079 Chest pain, unspecified: Secondary | ICD-10-CM

## 2023-11-24 DIAGNOSIS — R0602 Shortness of breath: Secondary | ICD-10-CM

## 2023-11-24 DIAGNOSIS — Z86711 Personal history of pulmonary embolism: Secondary | ICD-10-CM

## 2023-11-24 DIAGNOSIS — I10 Essential (primary) hypertension: Secondary | ICD-10-CM

## 2023-11-24 LAB — HEPATIC FUNCTION PANEL
BKR A/G RATIO: 1.2 (ref 1.0–2.2)
BKR ALANINE AMINOTRANSFERASE (ALT): 15 U/L (ref 10–35)
BKR ALBUMIN: 4.2 g/dL (ref 3.6–5.1)
BKR ALKALINE PHOSPHATASE: 80 U/L (ref 9–122)
BKR ASPARTATE AMINOTRANSFERASE (AST): 24 U/L (ref 10–35)
BKR AST/ALT RATIO: 1.6
BKR BILIRUBIN DIRECT: 0.1 mg/dL (ref <=0.2)
BKR BILIRUBIN TOTAL: 0.3 mg/dL (ref <=1.2)
BKR GLOBULIN: 3.6 g/dL (ref 2.0–3.9)
BKR PROTEIN TOTAL: 7.8 g/dL (ref 5.9–8.3)

## 2023-11-24 LAB — CBC WITH AUTO DIFFERENTIAL
BKR WAM ABSOLUTE IMMATURE GRANULOCYTES.: 0.01 x 1000/ÂµL (ref 0.00–0.30)
BKR WAM ABSOLUTE LYMPHOCYTE COUNT.: 1.31 x 1000/ÂµL (ref 0.60–3.70)
BKR WAM ABSOLUTE NRBC: 0 x 1000/ÂµL (ref 0.00–1.00)
BKR WAM ANC (ABSOLUTE NEUTROPHIL COUNT): 1.85 x 1000/ÂµL — ABNORMAL LOW (ref 2.00–7.60)
BKR WAM BASOPHIL ABSOLUTE COUNT.: 0.04 x 1000/ÂµL (ref 0.00–1.00)
BKR WAM BASOPHILS: 1.1 % (ref 0.0–1.4)
BKR WAM EOSINOPHIL ABSOLUTE COUNT.: 0.07 x 1000/ÂµL (ref 0.00–1.00)
BKR WAM EOSINOPHILS: 2 % (ref 0.0–5.0)
BKR WAM HEMATOCRIT: 42.4 % (ref 35.00–45.00)
BKR WAM HEMOGLOBIN: 13.9 g/dL (ref 11.7–15.5)
BKR WAM IMMATURE GRANULOCYTES: 0.3 % (ref 0.0–1.0)
BKR WAM LYMPHOCYTES: 37.4 % (ref 17.0–50.0)
BKR WAM MCH: 29.2 pg (ref 27.0–33.0)
BKR WAM MCHC: 32.8 g/dL (ref 31.0–36.0)
BKR WAM MCV: 89.1 fL (ref 80.0–100.0)
BKR WAM MONOCYTE ABSOLUTE COUNT.: 0.22 x 1000/ÂµL (ref 0.00–1.00)
BKR WAM MONOCYTES: 6.3 % (ref 4.0–12.0)
BKR WAM MPV: 9.5 fL (ref 8.0–12.0)
BKR WAM NEUTROPHILS: 52.9 % (ref 39.0–72.0)
BKR WAM NUCLEATED RED BLOOD CELLS: 0 % (ref 0.0–1.0)
BKR WAM PLATELETS: 225 x1000/ÂµL (ref 150–420)
BKR WAM RDW-CV: 13.2 % (ref 11.0–15.0)
BKR WAM RED BLOOD CELL COUNT.: 4.76 M/ÂµL (ref 4.00–6.00)
BKR WAM WHITE BLOOD CELL COUNT: 3.5 x1000/ÂµL — ABNORMAL LOW (ref 4.0–11.0)

## 2023-11-24 LAB — BASIC METABOLIC PANEL
BKR ANION GAP: 13 (ref 7–17)
BKR BLOOD UREA NITROGEN: 9 mg/dL (ref 6–20)
BKR BUN / CREAT RATIO: 12.9 (ref 8.0–23.0)
BKR CALCIUM: 9.6 mg/dL (ref 8.8–10.2)
BKR CHLORIDE: 105 mmol/L (ref 98–107)
BKR CO2: 25 mmol/L (ref 20–30)
BKR CREATININE DELTA: -0.05
BKR CREATININE: 0.7 mg/dL (ref 0.40–1.30)
BKR EGFR, CREATININE (CKD-EPI 2021): >60 mL/min/{1.73_m2} (ref >=60)
BKR GLUCOSE: 96 mg/dL (ref 70–100)
BKR POTASSIUM: 3.6 mmol/L (ref 3.3–5.3)
BKR SODIUM: 143 mmol/L (ref 136–144)

## 2023-11-24 LAB — D-DIMER, QUANTITATIVE: BKR D-DIMER: <0.19 mg{FEU}/L (ref <=0.50)

## 2023-11-24 LAB — TROPONIN T HIGH SENSITIVITY, 1 HOUR WITH REFLEX (BH GH LMW YH)
BKR TROPONIN T HS 1 HOUR DELTA FROM 0 HOUR: -1 ng/L
BKR TROPONIN T HS 1 HOUR: 8 ng/L

## 2023-11-24 LAB — HCG, QUANTITATIVE     (BH GH LMW YH): BKR HCG, QUANTITATIVE: <1 m[IU]/mL

## 2023-11-24 LAB — TROPONIN T HIGH SENSITIVITY, 0 HOUR BASELINE WITH REFLEX (BH GH LMW YH): BKR TROPONIN T HS 0 HOUR BASELINE: 9 ng/L

## 2023-11-24 MED ORDER — FAMOTIDINE 20 MG TABLET
20 | ORAL_TABLET | Freq: Two times a day (BID) | ORAL | 1 refills | 30.00000 days | Status: AC
Start: 2023-11-24 — End: ?

## 2023-11-24 MED ORDER — AMLODIPINE 5 MG TABLET
5 | ORAL_TABLET | Freq: Every day | ORAL | 1 refills | 90.00000 days | Status: AC
Start: 2023-11-24 — End: ?

## 2023-11-24 MED ORDER — HYDROCHLOROTHIAZIDE 25 MG TABLET
25 | ORAL_TABLET | Freq: Every day | ORAL | 1 refills | 30.00000 days | Status: AC
Start: 2023-11-24 — End: ?

## 2023-11-24 MED ORDER — HYDROCHLOROTHIAZIDE 12.5 MG TABLET
12.5 | Freq: Once | ORAL | Status: CP
Start: 2023-11-24 — End: ?
  Administered 2023-11-24: 19:00:00 12.5 mg via ORAL

## 2023-11-24 MED ORDER — AMLODIPINE 5 MG TABLET
5 | Freq: Once | ORAL | Status: CP
Start: 2023-11-24 — End: ?
  Administered 2023-11-24: 18:00:00 5 mg via ORAL

## 2023-11-24 NOTE — ED Notes
 SOCIAL WORK NOTEPatient Name: Tracie Bateson HowardMedical Record Number: [id number] Date of Birth: [birth year]Medical Social Work Follow Up  [employer] Most Recent Value Admission Information  Document Type Clinical Assessment - Able to Assess (For Inpatient/ED Only) Prior psychosocial assessment has been documented within this hospitalization No (For Inpatient/ED Only) Prior psychosocial assessment has been documented within 30 days of this hospitalization No Reason for Current Social Work Involvement Resources, Substance Use Source of Information Patient, Medical Team Record Reviewed Yes Level of Care Emergency Department What medium(s) of communication were used with patient/family/caregiver? Face-to-Face / In-Person Psychosocial issues requiring intervention Substance use Psychosocial interventions 10 minutes face to face with [name], her daugther was at bedside and was picked up by adult sibling.  I introduced myself and explained social work role.  [name] reports she tried an ectasy pill this morning, then in the afternoon used marijuana.  Her 41year old arrived home from school.  She started to feel ill, heart palpitations.l She asked her daugther to call [phone]. [name] denied substance misuse ongoing, declined to speak with [employer], or a need for substance use resources from social work.  She acknowledges she does use marijuana and it is stored away and in her room.  She acknowledges making a mistake today, and expressed remorse That's what I get for trying to be young.  I'll never do that again. I offered resources and support during assessment. IT trainer. [name]. Handoff Required? No Social Work will take lead to arrange post-acute care services and continue work with the care team as patient progresses towards discharge No Signature: Madelyn Schick, LCSW Contact Information: [phone]

## 2023-11-24 NOTE — ED Notes
 4:04 PM BIBA from home. Took ecstasy pill at 9 am, 3pm smoked mariajuana. C/O anxiousness, SOB, CP. Hx of htn Chief Complaint Patient presents with  Addiction Problem   Took ectasy, then smoked marijuana. Then became SOB/CP. Arrives with 41 yo daughter.  Chest Pain 5:07 PM PT reports feeling better, no more C/O CP, SOB or anxiety. 5:20 PM Call made to social work. 5:45 PM PT BP 169/116, MD made aware. Meds given per order. 5:50 PM Social work at bedside.6:22 PM Crackers and ginger ale provided, well tolerated. 6:55 PM Meds given per order.  PT ready for discharge. 7:07 PM Discharge instructions reviewed with patient, education provided on new medications, stated understanding. PT stated she will follow up with PCP regarding BP.

## 2023-11-24 NOTE — Discharge Instructions
 Must follow up with the medical clinic for further blood pressure management, further outpatient testing such as 2D echo, stress test and/or referral from medical clinic to cardiologist to further evaluate your complaints of chest pain, palpitation.  Return to ER immediately if recurrent chest pain, palpitation, dizziness, difficulty ambulating, nausea vomiting, not improving well.Continue taking your medications as prescribed by your doctor
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
## 2023-11-25 NOTE — Telephone Encounter
 Courtesy call placed to Tracie Gardner to discuss her f/u needs as there is no PCP listed.  Spoke with her and she states she will call [facility] to secure an appt and understands the importance of general health screenings.  She had no questions about her ED visit and was appreciative of the f/u call.

## 2023-11-26 NOTE — ED Provider Notes
 Emergency Department Attending Evaluation Note ED RM [id number] Complaint Patient presents with  Addiction Problem   Took ectasy, then smoked marijuana. Then became SOB/CP. Arrives with 41 yo daughter.  Chest Pain Reviewed nurses notes, vital signs, pulse oximetry, other history and pertinent diagnostic tests   History of Present Illness:History obtained from PtEdna Kelisha Dall is a 43 y.o. female with history of hypertension noted that she has been off her blood pressure medication since 2021, postpartum pulmonary embolism (2019, was on anticoagulants for short time, has been dc off meds since 2019 as per pt), vape, occasional marijuana, occasional ecstasy last dose 9:00 a.m. this morning, who presents with c/o retrosternal sharp/aching pain and palpitation starting 1430 today. Patient noted has been doing well with no recent illness.  States earlier this afternoon she got up from a sitting position going into the kitchen to make some cereal.  As she was standing, developed gradual onset of dizziness she describes as lightheadedness and slight sensation as if the room was spinning for couple sec with the associated gradual intermittent palpitation with associated anterior chest aching pain.  She states the chest pain would last for couple sec but then resolved spontaneously.  Currently continued to have palpitation but chest pain has resolved.  Has had some mild short of breath associated.  Has had no arm/neck/jaw pain, no abdomen/back pain, no nausea vomiting diarrhea, no fever chills, no coughing/hemoptysis.  No recent trauma/assault, no recent long distance travel, no extremity pain/swelling/redness, no recent surgeries.  She states she has no known history of diabetes, cardiac disease, known active cancer.================================================================================================================================Review of SystemsAll other systems reviewed and are negative except as noted on HPIPHYSICAL EXAMBP (!) 171/107  - Pulse 79  - Temp 97.8 ?F (36.6 ?C) (Oral)  - Resp 16  - SpO2 100% GENERAL: alert; no acute distress, non-diaphoretic, good eye contact, nontoxic, pleasant appearanceSKIN: warm; dry; normal in color, normal cap refill, no lesion/rash, MM moist, normal skin tugorHEAD: no visible trauma; normocephalicNECK: Trachea midline; normal range of motion; not visible trauma, supple, no nuchal rigidity. Normal soft tissue examEYE: normal conjunctiva/eyelid; EOMI, PERRLACARDIOVASCULAR: normal rate; regular rhythm; normal peripheral perfusion, normal equal palpable pulses B/LRESPIRATORY: Lungs CTA; respirations non-labored; no use of accessory muscles, no retraction. no chest wall tendernessGASTROINTESTINAL: soft; non distended; no rebound; no guarding; no pulsatile mass, no palpable herniaTenderness: NoEXTREMITIES: no visible deformity, no visible trauma, NVI, no extremity edema/tenderness, active/passive ROM of all jts with no pain/tendernessSPINE: NT, no stepoff/crepitusNEUROLOGICAL: Oriented x3; no focal neurological deficit observed; normal gait PSYCHIATRIC: cooperative; appropriate affect; normal judgement====================================================================  ====================================================================Medical Decision Making  / Differential Diagnosis & ED Course:Laurence Lillar Bianca is a 43 y.o. female with chest pain, palpitation current arrival to the ER as noted above.  Use of ecstasy earlier today.  Denies any alcohol use.  States has been off blood pressure meds since 2021.I have considered emergent and/or life/limb-threatening etiologies, including but not limited to: Atypical chest pain, psychosomatic/anxiety/panic attack, musculoskeletal, doubt acute coronary spasm/ACS.Do not suspect acute PE, acute aortic pathology, acute hypertensive urgency/emergency, acute CVA, acute encephalopathy, acute intestinal pathologyTherefore, the plan/course is as follows: Labs, EKGRepeat Evaluation / Exam:1736Patient resting comfortably.  Noted palpitation, chest pain has completely resolved.  Remains well-appearing, sitting upright.  She also noted she feels no longer anxious.  D-dimer is negative.  Clinical suspicion for acute PE is low.  We will not order CTA chest.  Waiting for repeat job.  EKG normal sinus rhythm, no acute ischemic changes. Patient remains well-appearing, neurological intact, clinically sober,  very pleasant.  Remained with no end-organ complaints despite noted blood pressure.  She has been off her blood pressure as noted above.  I discussed with the extensively regarding the importance of continued to follow up with her PCP for further blood pressure management and risk for continued poorly-controlled hypertension.  I also discussed with her the importance of refrain from using drugs, following up outpatient drug detox program, return back to ER immediately if recurrent chest pain, not improving well.  Via shared decision-making she is in agreeable for discharge home and not to be admitted or police on ED observation.  Patient has been instructed to follow up with PCP for further outpatient testing evaluation to confirm etiology of her chest pain, further blood pressure management.  She notes understand above, stable mental capacity to make own medical decision and had no further question or concern.-------------Number and Complexity of Problems Addressed:Differential Diagnosis:  See AboveRecords reviewed from external provider, facility, or healthcare organization: Pts previous labs, radiographic studies and medical charts has been reviewed History obtained from other source:    History obtained from PtIndependent visualization of image (radiology, ekg, rythym strip): I have directly visualized and independently interpreted the imaging as well. The patient had the following imaging; chest x-ray, the interpretation is no free air, no pneumothorax  I will follow up with the official radiologist reading. EKG independently viewed and interpreted by myself, I agree with computerized report (as annotated), please see scanned EKG.  EKG / Rhythm strip shows :  EKG normal sinus rhythm 78 beats per minute inverted T-wave AVR V1 lead 3, no acute ischemic changes, no major change compared to old EKGTests Considered But Not PerformedTests considered but not performed ultimately not pursued after discussion with patient / family. No CTA chest abdomen pelvis because risk of radiation and do not suspect acute aortic pathologyPrescription Medication ManagementCurrent Prescription Medications Reviewed and ContinuedSocial determinants of health considered and diagnosis and/or treatments were found to be significantly impacted by and limited due to patient's:Patient specific factorslack of access to medical carehistory of substance abusePatients history, exam, vitals, past history, clinical course, labs, and imaging are not indicative of a life threatening disease process.  An in depth discussion of patients chief complaint occurred.  Admission to the hospital and ED observation has been considered but patient is stable for discharge as noted above.  Patient was given precautions, told to return with worsening symptoms or new symptoms.  Follow up was strongly encouraged.  Concha Deed Medicine Attending [phone] (ED)11/26/2023 4:20 PMDISCLAIMER:  This chart was created using M-modal dictation software. Efforts were made by me to ensure accuracy, however some errors may be present due to limitation of this technology and occasionally words are not transcribed as intended. ProceduresAttestation/Critical CareClinical Impressions as of 11/26/23 1325 Chest pain, unspecified type Palpitation Uncontrolled hypertension Noncompliance with medication treatment due to underuse of medication Substance use disorder  ED DispositionDischarge  Davionne Mastrangelo, DO06/12/25 1325

## 2023-12-24 ENCOUNTER — Inpatient Hospital Stay: Admit: 2023-12-24 | Discharge: 2023-12-25 | Payer: MEDICAID | Attending: Emergency Medicine

## 2023-12-24 ENCOUNTER — Emergency Department: Admit: 2023-12-24 | Payer: MEDICAID | Attending: Diagnostic Radiology

## 2023-12-24 DIAGNOSIS — R42 Dizziness and giddiness: Secondary | ICD-10-CM

## 2023-12-24 MED ORDER — SODIUM CHLORIDE 0.9 % IV BOLUS NEW BAG (DROPS CHARGE)
0.9 | Freq: Once | INTRAVENOUS | Status: CP
Start: 2023-12-24 — End: ?
  Administered 2023-12-24: 23:00:00 0.9 mL/h via INTRAVENOUS

## 2023-12-25 DIAGNOSIS — Z91013 Allergy to seafood: Secondary | ICD-10-CM

## 2023-12-25 DIAGNOSIS — Z9109 Other allergy status, other than to drugs and biological substances: Secondary | ICD-10-CM

## 2023-12-25 DIAGNOSIS — R55 Syncope and collapse: Principal | ICD-10-CM

## 2023-12-25 LAB — URINALYSIS-MACROSCOPIC W/REFLEX MICROSCOPIC
BKR BILIRUBIN, UA: NEGATIVE
BKR BLOOD, UA: NEGATIVE
BKR GLUCOSE, UA: NEGATIVE
BKR KETONES, UA: NEGATIVE
BKR NITRITE, UA: NEGATIVE
BKR PH, UA: 6.5 (ref 5.5–7.5)
BKR PROTEIN, UA: NEGATIVE
BKR SPECIFIC GRAVITY, UA: 1.021 (ref 1.005–1.030)
BKR UROBILINOGEN, UA: <2 mg/dL (ref <=2.0)

## 2023-12-25 LAB — URINE MICROSCOPIC     (BH GH LMW YH)
BKR HYALINE CASTS, UA (INSTRUMENT): 1 /LPF (ref 0–3)
BKR RBC/HPF, UA (INSTRUMENT): 2 /HPF (ref 0–2)
BKR URINE SQUAMOUS EPITHELIAL CELLS, UA (INSTRUMENT): 8 /HPF — ABNORMAL HIGH (ref 0–5)
BKR WBC/HPF, UA (INSTRUMENT): 3 /HPF (ref 0–5)

## 2023-12-25 LAB — CBC WITH AUTO DIFFERENTIAL
BKR WAM ABSOLUTE IMMATURE GRANULOCYTES.: 0.01 x 1000/ÂµL (ref 0.00–0.30)
BKR WAM ABSOLUTE LYMPHOCYTE COUNT.: 1.71 x 1000/ÂµL (ref 0.60–3.70)
BKR WAM ABSOLUTE NRBC: 0 x 1000/ÂµL (ref 0.00–1.00)
BKR WAM ANC (ABSOLUTE NEUTROPHIL COUNT): 1.95 x 1000/ÂµL — ABNORMAL LOW (ref 2.00–7.60)
BKR WAM BASOPHIL ABSOLUTE COUNT.: 0.05 x 1000/ÂµL (ref 0.00–1.00)
BKR WAM BASOPHILS: 1.2 % (ref 0.0–1.4)
BKR WAM EOSINOPHIL ABSOLUTE COUNT.: 0.14 x 1000/ÂµL (ref 0.00–1.00)
BKR WAM EOSINOPHILS: 3.3 % (ref 0.0–5.0)
BKR WAM HEMATOCRIT: 37.7 % (ref 35.00–45.00)
BKR WAM HEMOGLOBIN: 12.4 g/dL (ref 11.7–15.5)
BKR WAM IMMATURE GRANULOCYTES: 0.2 % (ref 0.0–1.0)
BKR WAM LYMPHOCYTES: 40.4 % (ref 17.0–50.0)
BKR WAM MCH: 28.6 pg (ref 27.0–33.0)
BKR WAM MCHC: 32.9 g/dL (ref 31.0–36.0)
BKR WAM MCV: 87.1 fL (ref 80.0–100.0)
BKR WAM MONOCYTE ABSOLUTE COUNT.: 0.37 x 1000/ÂµL (ref 0.00–1.00)
BKR WAM MONOCYTES: 8.7 % (ref 4.0–12.0)
BKR WAM MPV: 9.6 fL (ref 8.0–12.0)
BKR WAM NEUTROPHILS: 46.2 % (ref 39.0–72.0)
BKR WAM NUCLEATED RED BLOOD CELLS: 0 % (ref 0.0–1.0)
BKR WAM PLATELETS: 237 x1000/ÂµL (ref 150–420)
BKR WAM RDW-CV: 12.6 % (ref 11.0–15.0)
BKR WAM RED BLOOD CELL COUNT.: 4.33 M/ÂµL (ref 4.00–6.00)
BKR WAM WHITE BLOOD CELL COUNT: 4.2 x1000/ÂµL (ref 4.0–11.0)

## 2023-12-25 LAB — BASIC METABOLIC PANEL
BKR ANION GAP: 9 (ref 7–17)
BKR BLOOD UREA NITROGEN: 16 mg/dL (ref 6–20)
BKR BUN / CREAT RATIO: 21.6 (ref 8.0–23.0)
BKR CALCIUM: 8.8 mg/dL (ref 8.8–10.2)
BKR CHLORIDE: 104 mmol/L (ref 98–107)
BKR CO2: 27 mmol/L (ref 20–30)
BKR CREATININE DELTA: 0.04
BKR CREATININE: 0.74 mg/dL (ref 0.40–1.30)
BKR EGFR, CREATININE (CKD-EPI 2021): >60 mL/min/1.73m2 (ref >=60)
BKR GLUCOSE: 101 mg/dL — ABNORMAL HIGH (ref 70–100)
BKR POTASSIUM: 3.7 mmol/L (ref 3.3–5.3)
BKR SODIUM: 140 mmol/L (ref 136–144)

## 2023-12-25 NOTE — ED Notes
 10:04 PM Patient presenting to ED for evaluation of Chief Complaint Patient presents with  Near Syncope   near syncope, +dizziness, increase thirst, +bloating, +nausea. Hx of high BP, states she ran out of the meds 2 days ago.  Pt states she had 2 syncopal events where she passed out, first was a few hours ago after showering, last one shortly after shower. Pt endorses increased thirst, dizziness, bloating, nausea, and tingling/cramping of all extremities. Pt denies any HA, SOB, CP, abd pain, GI/GU complaints. No diabetes hx. Pt states she ran out of BP meds 2 days ago, BP stable on arrival. Pt A&Ox4, VS stable on arrival. Neuro assessment stable. BG 104. Pt pending provider consult. 10:14 PM  Provider at bedside. 11:24 PM PIV est. Labs drawn. Fluids infusing per MAR. Pt pending imaging. 1:32 AM Urine sample collected. 2:36 AM PIV removed. VS stable. Discharge paperwork provided, no questions at this time. Pt ambulatory upon discharge, steady gait noted upon exit.

## 2023-12-25 NOTE — Discharge Instructions
 Important Discharge Instructions:You were seen at [facility] Emergency room on 12/25/2023 for lightheadedness/almost fainting. Your exams, labs and imaging were not concerning for heart attack, electrolyte abnormality or infection. The next step in your care is to: Make sure to drink plenty of water  especially in hot days.  Eat small frequent meals throughout the day.You will need to follow up with your primary care doctor within a week to review your visit to the Emergency Room.  Call your doctor's office in the next business day to schedule an appointment and to let your doctor know that your condition required a visit to the Emergency Room.Return to ED if any of the following develop: - Chest pain and/or shortness of breath- Fever above 100.4- Vomiting that does not stop- Symptoms worsen or something changes - Any other symptoms that you find concerning/worrying
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
## 2023-12-26 LAB — URINE CULTURE

## 2023-12-28 ENCOUNTER — Encounter: Admit: 2023-12-28 | Payer: PRIVATE HEALTH INSURANCE

## 2023-12-28 NOTE — Telephone Encounter
 Urine Culture, Routine 10,000-49,000 CFU/mL Beta-Hemolytic Streptococcus Group B Abnormal   Result received in ED follow up. Call to pt to check on her and inquire about urinary symptoms. A VM was left requesting return call. Result seen by pt, My Chart message sent.

## 2023-12-29 NOTE — Care Coordination-Inpatient
 MyChart message reviewed with the information about Tracie Gardner's urine cx report and recommendation for f/u

## 2024-05-19 ENCOUNTER — Encounter: Admit: 2024-05-19 | Payer: PRIVATE HEALTH INSURANCE

## 2024-06-03 ENCOUNTER — Inpatient Hospital Stay
Admit: 2024-06-03 | Discharge: 2024-06-04 | Payer: MEDICAID | Attending: Student in an Organized Health Care Education/Training Program

## 2024-06-03 ENCOUNTER — Emergency Department: Admit: 2024-06-03 | Payer: MEDICAID

## 2024-06-03 LAB — CBC WITH AUTO DIFFERENTIAL
BKR WAM ABSOLUTE IMMATURE GRANULOCYTES.: 0.01 x 1000/ÂµL (ref 0.00–0.30)
BKR WAM ABSOLUTE LYMPHOCYTE COUNT.: 1.98 x 1000/ÂµL (ref 0.60–3.70)
BKR WAM ABSOLUTE NRBC: 0 x 1000/ÂµL (ref 0.00–1.00)
BKR WAM ANC (ABSOLUTE NEUTROPHIL COUNT): 1.79 x 1000/ÂµL — ABNORMAL LOW (ref 2.00–7.60)
BKR WAM BASOPHIL ABSOLUTE COUNT.: 0.03 x 1000/ÂµL (ref 0.00–1.00)
BKR WAM BASOPHILS: 0.7 % (ref 0.0–1.4)
BKR WAM EOSINOPHIL ABSOLUTE COUNT.: 0.15 x 1000/ÂµL (ref 0.00–1.00)
BKR WAM EOSINOPHILS: 3.5 % (ref 0.0–5.0)
BKR WAM HEMATOCRIT: 41.1 % (ref 35.00–45.00)
BKR WAM HEMOGLOBIN: 13.8 g/dL (ref 11.7–15.5)
BKR WAM IMMATURE GRANULOCYTES: 0.2 % (ref 0.0–1.0)
BKR WAM LYMPHOCYTES: 46 % (ref 17.0–50.0)
BKR WAM MCH: 28.8 pg (ref 27.0–33.0)
BKR WAM MCHC: 33.6 g/dL (ref 31.0–36.0)
BKR WAM MCV: 85.6 fL (ref 80.0–100.0)
BKR WAM MONOCYTE ABSOLUTE COUNT.: 0.34 x 1000/ÂµL (ref 0.00–1.00)
BKR WAM MONOCYTES: 7.9 % (ref 4.0–12.0)
BKR WAM MPV: 9.6 fL (ref 8.0–12.0)
BKR WAM NEUTROPHILS: 41.7 % (ref 39.0–72.0)
BKR WAM NUCLEATED RED BLOOD CELLS: 0 % (ref 0.0–1.0)
BKR WAM PLATELETS: 245 x1000/ÂµL (ref 150–420)
BKR WAM RDW-CV: 13 % (ref 11.0–15.0)
BKR WAM RED BLOOD CELL COUNT.: 4.8 M/ÂµL (ref 4.00–6.00)
BKR WAM WHITE BLOOD CELL COUNT: 4.3 x1000/ÂµL (ref 4.0–11.0)

## 2024-06-03 LAB — BASIC METABOLIC PANEL
BKR ANION GAP: 8 (ref 7–17)
BKR BLOOD UREA NITROGEN: 12 mg/dL (ref 6–20)
BKR BUN / CREAT RATIO: 18.5 (ref 8.0–23.0)
BKR CALCIUM: 8.7 mg/dL — ABNORMAL LOW (ref 8.8–10.2)
BKR CHLORIDE: 107 mmol/L (ref 98–107)
BKR CO2: 24 mmol/L (ref 20–30)
BKR CREATININE DELTA: -0.05
BKR CREATININE: 0.65 mg/dL (ref 0.40–1.30)
BKR EGFR, CREATININE (CKD-EPI 2021): >60 mL/min/1.73m2 (ref >=60)
BKR GLUCOSE: 90 mg/dL (ref 70–100)
BKR POTASSIUM: 3.6 mmol/L (ref 3.3–5.3)
BKR SODIUM: 139 mmol/L (ref 136–144)

## 2024-06-03 LAB — SARS-COV-2 (COVID-19)/INFLUENZA A+B/RSV BY RT-PCR (BH GH LMW YH)
BKR INFLUENZA A: NEGATIVE
BKR INFLUENZA B: NEGATIVE
BKR RESPIRATORY SYNCYTIAL VIRUS: NEGATIVE
BKR SARS-COV-2 RNA (COVID-19) (YH): NEGATIVE

## 2024-06-03 LAB — TSH W/REFLEX TO FT4     (BH GH LMW Q YH): BKR THYROID STIMULATING HORMONE: 1.07 u[IU]/mL

## 2024-06-03 LAB — TROPONIN T HIGH SENSITIVITY, 0 HOUR BASELINE WITH REFLEX (BH GH LMW YH): BKR TROPONIN T HS 0 HOUR BASELINE: <6 ng/L

## 2024-06-04 DIAGNOSIS — Z91013 Allergy to seafood: Secondary | ICD-10-CM

## 2024-06-04 DIAGNOSIS — R0602 Shortness of breath: Secondary | ICD-10-CM

## 2024-06-04 DIAGNOSIS — Z20822 Contact with and (suspected) exposure to covid-19: Secondary | ICD-10-CM

## 2024-06-04 DIAGNOSIS — Z9109 Other allergy status, other than to drugs and biological substances: Secondary | ICD-10-CM

## 2024-06-04 LAB — URINALYSIS WITH CULTURE REFLEX      (BH LMW YH)
BKR BILIRUBIN, UA: NEGATIVE
BKR BLOOD, UA: NEGATIVE
BKR GLUCOSE, UA: NEGATIVE
BKR KETONES, UA: NEGATIVE
BKR LEUKOCYTE ESTERASE, UA: NEGATIVE
BKR NITRITE, UA: NEGATIVE
BKR PH, UA: 6.5 (ref 5.5–7.5)
BKR PROTEIN, UA: NEGATIVE
BKR SPECIFIC GRAVITY, UA: 1.023 (ref 1.005–1.030)
BKR UROBILINOGEN, UA: <2 mg/dL (ref <=2.0)

## 2024-06-04 LAB — UA REFLEX CULTURE

## 2024-06-04 NOTE — ED Notes [6]
 8:41 PM Pt arrives to ED stating that she had a couple of panic attacks today. Pt has history of anxiety, take atarax. Pt also reported shortness of breath, denies CP. Pt denies N/V/D, fever/chills. A&OX4. Chief Complaint Patient presents with  Shortness of Breath   SOB ongoing X several hours, states I feel like there is a blueness to my hands. VSS on RA. Denies CP.  2:18 AM Pt received d/c instructions by MD provider. Pt understands d/c instructions. All questions answered. Ambulatory w/ steady gait leaving AED. VSS on RA.

## 2024-06-04 NOTE — ED Provider Notes [19]
 Chief Complaint Patient presents with  Shortness of Breath   SOB ongoing X several hours, states I feel like there is a blueness to my hands. VSS on RA. Denies CP.  History of Present IllnessEdna Elmina Gardner is a 43 year old female with uncontrolled hypertension who presents with shortness of breath and cyanosis of the digits.She began experiencing shortness of breath several hours ago, accompanied by cyanosis of her digits. Her hands turn 'ice blue and cold' even in warm environments, and her feet are similarly affected. She has a history of palpitations and near-syncope, which prompted her to call emergency services. She experiences shortness of breath when climbing stairs or walking, and does not usually experience chest pain during these activities. She reports sharp pains under her ribs that occur even when sitting.She has a history of uncontrolled hypertension and has been off her medication for two days due to a delay in refill, but resumed it today at 4 PM. Her blood pressure was recorded at 166/99. She also experiences anxiety and takes hydroxyzine for it, which she took today during a panic attack at work.She reports feeling dehydrated, with symptoms of thirst, dry skin, and cold extremities. She drinks a lot of water  but remains thirsty. She denies having diabetes but has been experiencing these symptoms for weeks. She has noticed a significant weight gain from 170 to 191 pounds over a few months, despite feeling full quickly and eating less. 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 She was on blood thinners for a year post-embolism but is not currently on any.She has a family history of neurological issues, with her sister and cousin having undergone surgery for brain tumors. She experienced a sharp head pain recently that caused sweating and increased blood pressure. She is a former smoker, having quit in September 2025.On arrival patient is asymptomatic hemodynamically stable, and without complaints.MDM  Physical ExamED Triage Vitals [06/03/24 1853]BP: (!) 166/98Pulse: 74Pulse from  O2 sat: n/aResp: 20Temp: 97.1 ?F (36.2 ?C)Temp src: TemporalSpO2: 98 % BP (!) 147/95  - Pulse 64  - Temp 99.3 ?F (37.4 ?C) (Oral)  - Resp 18  - SpO2 99% Physical ExamPhysical ExamVITALS: BP- 152/99MEASUREMENTS: Weight- 191.GENERAL: Cooperative, in acute distress.HEENT: Oropharynx clear. Mucous membranes moist.CHEST: Lungs clear to auscultation bilaterally.CARDIOVASCULAR: Pulses 2+ in upper and lower extremities.ABDOMEN: Abdomen soft, non-tender, no peritoneal signs. No CVA tenderness.EXTREMITIES: 1+ lower extremity edema, no signs of DVT. Capillary refill <2 seconds. Hands well perfused, normothermic.MUSCULOSKELETAL: No hematomas, abrasions, or bony tenderness.NEUROLOGICAL: Neurologically intact. Pupils equal, round, reactive to light. Medical Decision Making44 year old female with a history of uncontrolled hypertension, prior obstetric pulmonary embolism, and anxiety presented with acute onset shortness of breath, tachycardia, cyanosis of digits, and elevated blood pressure after missing antihypertensive medication for two days. She reported recurrent episodes of blue, cold extremities, increased thirst, and urinary frequency over several weeks. On exam, she was normothermic, well-perfused, with normal cardiorespiratory and neurological findings, and no chest pain or respiratory distress at the time of evaluation. No focal deficits or signs of acute systemic illness were noted.Differential diagnosis includes, but is not limited to:- Panic Attack/Acute Anxiety: Acute anxiety with panic attacks was considered as the likely cause of her symptoms given the episodic nature, associated tachycardia, and improvement with hydroxyzine, though other causes were being ruled out.- Hypertensive Urgency: Hypertensive urgency was considered due to elevated blood pressure readings and recent non-adherence to  antihypertensive medication, but without evidence of end-organ damage on exam.- Raynaud Phenomenon: Raynaud phenomenon was considered as the cause of episodic blue, cold hands and feet, with a clear demarcation and no evidence of acute vascular compromise on exam.- Acute Coronary Syndrome: Acute coronary syndrome was considered due to episodes of tachycardia and chest discomfort, prompting workup with EKG and troponin, though no ongoing chest pain or high-risk features were present.- Diabetes Mellitus: Diabetes mellitus was considered as a possible cause of polydipsia and polyuria, with A1c ordered to evaluate for hyperglycemia.- Acute Pulmonary Embolism: Acute pulmonary embolism was considered given her history and symptoms, but was thought less likely due to lack of chest pain, hypoxia, or respiratory distress on exam.- Acute Stroke: Acute stroke was considered due to reported transient neurological symptoms, but was thought unlikely given normal neurological exam and absence of focal deficits; time of last known well was not specified.Hypertensive urgency, acute anxiety with panic attacks, Raynaud phenomenon, and evaluation for acute cardiopulmonary and metabolic causes- Ordered EKG, troponin, metabolic panel, A1c (deferred as been collected several days prior), urinalysis, and chest x-ray- Continued hydroxyzine for anxiety management- Discussed potential treatment options for Raynaud phenomenon with primary care physician- Discharged home with instructions to follow up with primary care and return for any worsening symptomsProceduresAttestation/Critical CarePatient Reevaluation: Attending Supervised: ResidentI saw and examined the patient. I agree with the findings and plan of care as documented in the resident's note except as noted below. HPI: Patient is a 44 y.o. female past medical history anxiety, remote history of provoked PE (not on anticoagulation) who presents with shortness of breath. Reports intermittent palpitations, tachycardia. Reports dysuria. EZ:Hzwzmjo:  Well-nourished, alert and oriented x3.  Interactive and responsive to questioning.  Resting comfortably in the bed.CV: RRR, no murmurs appreciated.  2+ radial pulses. Pulm:  Normal effort and air entry.  Lungs are clear to auscultation bilaterally.  No tachypnea or respiratory distress.Skin: Warm, dry, intact.  No lesions noted on exposed skin. Fingers are appropriate color at this time, not pale.FIF:Ejupzwu is a 43 yo female who presents with shortness of breath. Differential includes viral syndrome, including COVID, influenza, pneumonia, UTI, ACS, PE. Considered PE, but less likely based on presentation today. She is not tachycardic, tachypneic and she is saturating well on room air. Low suspicion for PE. Will assess for infectious etiology. Concern for PNA, will order CXR. Concern for ACS, will order troponins. Rosina Livings, MD 06/03/2024 9:35 PMComments as of 06/04/24 0845 Fri Jun 03, 2024 2247 Signed out to me at 2300.Pending [ ]  UA [ ]   CXRThen home [ZR] 2303 Signout to me, pending Trop, CXR.  [ZB] 2329 CXR: No acute cardiothoracic abnormality. [ZR] Sat Jun 04, 2024 0129 UA negative for infection. [ZR] 0135 Patient resting comfortably. She was updated on results of labs, UA, CXR showing no acute abnormality, return precautions and follow up plan. Patient agreeable with this plan and comfortable with discharge.  [ZR]  Comments User Index[ZB] Boivin, Zachary S, MD[ZR] Rasheed, Zainab, [place]   Clinical Impressions as of 06/04/24 0845 Shortness of breath  ED DispositionDischargeI provided a concise overview of the ambient note generation solution. Maceo Estela Barrio or their legally authorized representative verbally consented to a temporary audio recording of their visit to assist with completing the visit documentation using an AI-powered solution. This note was reviewed for accuracy by Dorn Hoff who performed the clinical service. Kempel, Jonathan, MDResident12/19/25 2337 Boivin, Zachary S, MD12/20/25 0055 Livings Rosina, MD12/20/25 [phone]

## 2024-06-04 NOTE — Discharge Instructions [18]
 Important Discharge Instructions:You were seen at [facility] Emergency room on 06/04/2024 for shortness of breath.Your exams, labs and imaging were not concerning for infection, heart attack, or electrolyte abnormality. You will need to follow up with your primary care doctor within a week to review your visit to the Emergency Room.  Call your doctor's office in the next business day to schedule an appointment and to let your doctor know that your condition required a visit to the Emergency Room.Return to ED if any of the following develop: - Chest pain and/or shortness of breath- Fever above 100.4- Vomiting that does not stop- Symptoms worsen or something changes - Any other symptoms that you find concerning/worrying
